# Patient Record
Sex: Female | Born: 1945
Health system: Southern US, Community
[De-identification: ages and names within clinical notes are randomized; demographics above are authoritative.]

## PROBLEM LIST (undated history)

## (undated) DIAGNOSIS — R7989 Other specified abnormal findings of blood chemistry: Secondary | ICD-10-CM

## (undated) DIAGNOSIS — E039 Hypothyroidism, unspecified: Secondary | ICD-10-CM

## (undated) DIAGNOSIS — B3731 Acute candidiasis of vulva and vagina: Secondary | ICD-10-CM

## (undated) DIAGNOSIS — Q2111 Secundum atrial septal defect: Secondary | ICD-10-CM

## (undated) DIAGNOSIS — Z8709 Personal history of other diseases of the respiratory system: Secondary | ICD-10-CM

## (undated) DIAGNOSIS — G4730 Sleep apnea, unspecified: Secondary | ICD-10-CM

## (undated) DIAGNOSIS — R350 Frequency of micturition: Secondary | ICD-10-CM

## (undated) DIAGNOSIS — I10 Essential (primary) hypertension: Secondary | ICD-10-CM

## (undated) DIAGNOSIS — J189 Pneumonia, unspecified organism: Secondary | ICD-10-CM

## (undated) DIAGNOSIS — F32A Depression, unspecified: Secondary | ICD-10-CM

## (undated) DIAGNOSIS — F329 Major depressive disorder, single episode, unspecified: Secondary | ICD-10-CM

## (undated) DIAGNOSIS — G629 Polyneuropathy, unspecified: Secondary | ICD-10-CM

## (undated) DIAGNOSIS — I872 Venous insufficiency (chronic) (peripheral): Secondary | ICD-10-CM

## (undated) DIAGNOSIS — K529 Noninfective gastroenteritis and colitis, unspecified: Secondary | ICD-10-CM

## (undated) DIAGNOSIS — B373 Candidiasis of vulva and vagina: Secondary | ICD-10-CM

## (undated) DIAGNOSIS — M1990 Unspecified osteoarthritis, unspecified site: Secondary | ICD-10-CM

## (undated) DIAGNOSIS — E785 Hyperlipidemia, unspecified: Secondary | ICD-10-CM

## (undated) DIAGNOSIS — I50812 Chronic right heart failure: Secondary | ICD-10-CM

## (undated) DIAGNOSIS — K648 Other hemorrhoids: Secondary | ICD-10-CM

## (undated) DIAGNOSIS — K219 Gastro-esophageal reflux disease without esophagitis: Secondary | ICD-10-CM

## (undated) DIAGNOSIS — E119 Type 2 diabetes mellitus without complications: Secondary | ICD-10-CM

## (undated) DIAGNOSIS — Q211 Atrial septal defect: Secondary | ICD-10-CM

## (undated) DIAGNOSIS — E349 Endocrine disorder, unspecified: Secondary | ICD-10-CM

## (undated) DIAGNOSIS — I5189 Other ill-defined heart diseases: Secondary | ICD-10-CM

## (undated) HISTORY — PX: COLONOSCOPY: SHX174

## (undated) HISTORY — DX: Secundum atrial septal defect: Q21.11

## (undated) HISTORY — DX: Venous insufficiency (chronic) (peripheral): I87.2

## (undated) HISTORY — DX: Gastro-esophageal reflux disease without esophagitis: K21.9

## (undated) HISTORY — PX: PARATHYROIDECTOMY: SHX19

## (undated) HISTORY — PX: OTHER SURGICAL HISTORY: SHX169

## (undated) HISTORY — DX: Essential (primary) hypertension: I10

## (undated) HISTORY — PX: CERVICAL BIOPSY: SHX590

## (undated) HISTORY — DX: Chronic right heart failure: I50.812

## (undated) HISTORY — DX: Other ill-defined heart diseases: I51.89

## (undated) HISTORY — PX: TONSILLECTOMY AND ADENOIDECTOMY: SUR1326

## (undated) HISTORY — DX: Atrial septal defect: Q21.1

## (undated) HISTORY — DX: Hyperlipidemia, unspecified: E78.5

---

## 1998-07-26 ENCOUNTER — Encounter: Payer: Self-pay | Admitting: *Deleted

## 1998-07-26 ENCOUNTER — Ambulatory Visit (HOSPITAL_COMMUNITY): Admission: RE | Admit: 1998-07-26 | Discharge: 1998-07-26 | Payer: Self-pay | Admitting: *Deleted

## 1998-08-20 ENCOUNTER — Ambulatory Visit (HOSPITAL_COMMUNITY): Admission: RE | Admit: 1998-08-20 | Discharge: 1998-08-20 | Payer: Self-pay | Admitting: *Deleted

## 1999-05-02 ENCOUNTER — Ambulatory Visit (HOSPITAL_COMMUNITY): Admission: RE | Admit: 1999-05-02 | Discharge: 1999-05-02 | Payer: Self-pay | Admitting: *Deleted

## 1999-05-02 ENCOUNTER — Encounter: Payer: Self-pay | Admitting: *Deleted

## 1999-07-29 ENCOUNTER — Other Ambulatory Visit: Admission: RE | Admit: 1999-07-29 | Discharge: 1999-07-29 | Payer: Self-pay | Admitting: *Deleted

## 2000-10-05 ENCOUNTER — Other Ambulatory Visit: Admission: RE | Admit: 2000-10-05 | Discharge: 2000-10-05 | Payer: Self-pay | Admitting: *Deleted

## 2000-11-12 ENCOUNTER — Emergency Department (HOSPITAL_COMMUNITY): Admission: EM | Admit: 2000-11-12 | Discharge: 2000-11-12 | Payer: Self-pay | Admitting: Emergency Medicine

## 2001-10-03 ENCOUNTER — Other Ambulatory Visit: Admission: RE | Admit: 2001-10-03 | Discharge: 2001-10-03 | Payer: Self-pay | Admitting: *Deleted

## 2002-11-10 ENCOUNTER — Other Ambulatory Visit: Admission: RE | Admit: 2002-11-10 | Discharge: 2002-11-10 | Payer: Self-pay | Admitting: *Deleted

## 2004-01-07 ENCOUNTER — Ambulatory Visit (HOSPITAL_COMMUNITY): Admission: RE | Admit: 2004-01-07 | Discharge: 2004-01-07 | Payer: Self-pay | Admitting: *Deleted

## 2006-04-06 ENCOUNTER — Ambulatory Visit: Payer: Self-pay | Admitting: Family Medicine

## 2006-05-31 ENCOUNTER — Ambulatory Visit (HOSPITAL_COMMUNITY): Admission: RE | Admit: 2006-05-31 | Discharge: 2006-05-31 | Payer: Self-pay | Admitting: *Deleted

## 2006-07-15 ENCOUNTER — Ambulatory Visit (HOSPITAL_COMMUNITY): Admission: RE | Admit: 2006-07-15 | Discharge: 2006-07-15 | Payer: Self-pay | Admitting: Cardiology

## 2006-07-15 HISTORY — PX: NM MYOCAR PERF WALL MOTION: HXRAD629

## 2006-07-21 ENCOUNTER — Encounter (INDEPENDENT_AMBULATORY_CARE_PROVIDER_SITE_OTHER): Payer: Self-pay | Admitting: Cardiology

## 2006-07-21 ENCOUNTER — Ambulatory Visit (HOSPITAL_COMMUNITY): Admission: RE | Admit: 2006-07-21 | Discharge: 2006-07-21 | Payer: Self-pay | Admitting: Cardiology

## 2006-07-21 HISTORY — PX: CARDIAC CATHETERIZATION: SHX172

## 2006-08-09 ENCOUNTER — Ambulatory Visit (HOSPITAL_COMMUNITY): Admission: RE | Admit: 2006-08-09 | Discharge: 2006-08-09 | Payer: Self-pay | Admitting: Cardiology

## 2006-08-13 ENCOUNTER — Ambulatory Visit (HOSPITAL_COMMUNITY): Admission: RE | Admit: 2006-08-13 | Discharge: 2006-08-14 | Payer: Self-pay | Admitting: Cardiology

## 2006-08-13 HISTORY — PX: TRANSESOPHAGEAL ECHOCARDIOGRAM: SHX273

## 2006-09-10 ENCOUNTER — Ambulatory Visit: Payer: Self-pay | Admitting: Cardiothoracic Surgery

## 2006-10-18 ENCOUNTER — Ambulatory Visit: Payer: Self-pay | Admitting: Cardiothoracic Surgery

## 2006-10-18 ENCOUNTER — Inpatient Hospital Stay (HOSPITAL_COMMUNITY): Admission: RE | Admit: 2006-10-18 | Discharge: 2006-10-23 | Payer: Self-pay | Admitting: Cardiothoracic Surgery

## 2006-11-12 ENCOUNTER — Encounter: Admission: RE | Admit: 2006-11-12 | Discharge: 2006-11-12 | Payer: Self-pay | Admitting: Cardiothoracic Surgery

## 2006-11-12 ENCOUNTER — Ambulatory Visit: Payer: Self-pay | Admitting: Cardiothoracic Surgery

## 2006-12-23 ENCOUNTER — Encounter (HOSPITAL_COMMUNITY): Admission: RE | Admit: 2006-12-23 | Discharge: 2007-03-22 | Payer: Self-pay | Admitting: Cardiology

## 2006-12-23 ENCOUNTER — Ambulatory Visit: Payer: Self-pay | Admitting: Cardiothoracic Surgery

## 2007-03-24 ENCOUNTER — Encounter (HOSPITAL_COMMUNITY): Admission: RE | Admit: 2007-03-24 | Discharge: 2007-04-22 | Payer: Self-pay | Admitting: Cardiology

## 2007-03-24 HISTORY — PX: OTHER SURGICAL HISTORY: SHX169

## 2007-04-24 ENCOUNTER — Encounter (HOSPITAL_COMMUNITY): Admission: RE | Admit: 2007-04-24 | Discharge: 2007-07-23 | Payer: Self-pay | Admitting: Cardiology

## 2007-09-21 HISTORY — PX: OTHER SURGICAL HISTORY: SHX169

## 2008-05-07 IMAGING — CR DG CHEST 1V PORT
1 series · 1 of 1 positions shown · non-contrast
Comparison: 10/11/06

Exam: Chest, one view.

HISTORY: Status post CABG.

[view not recorded]
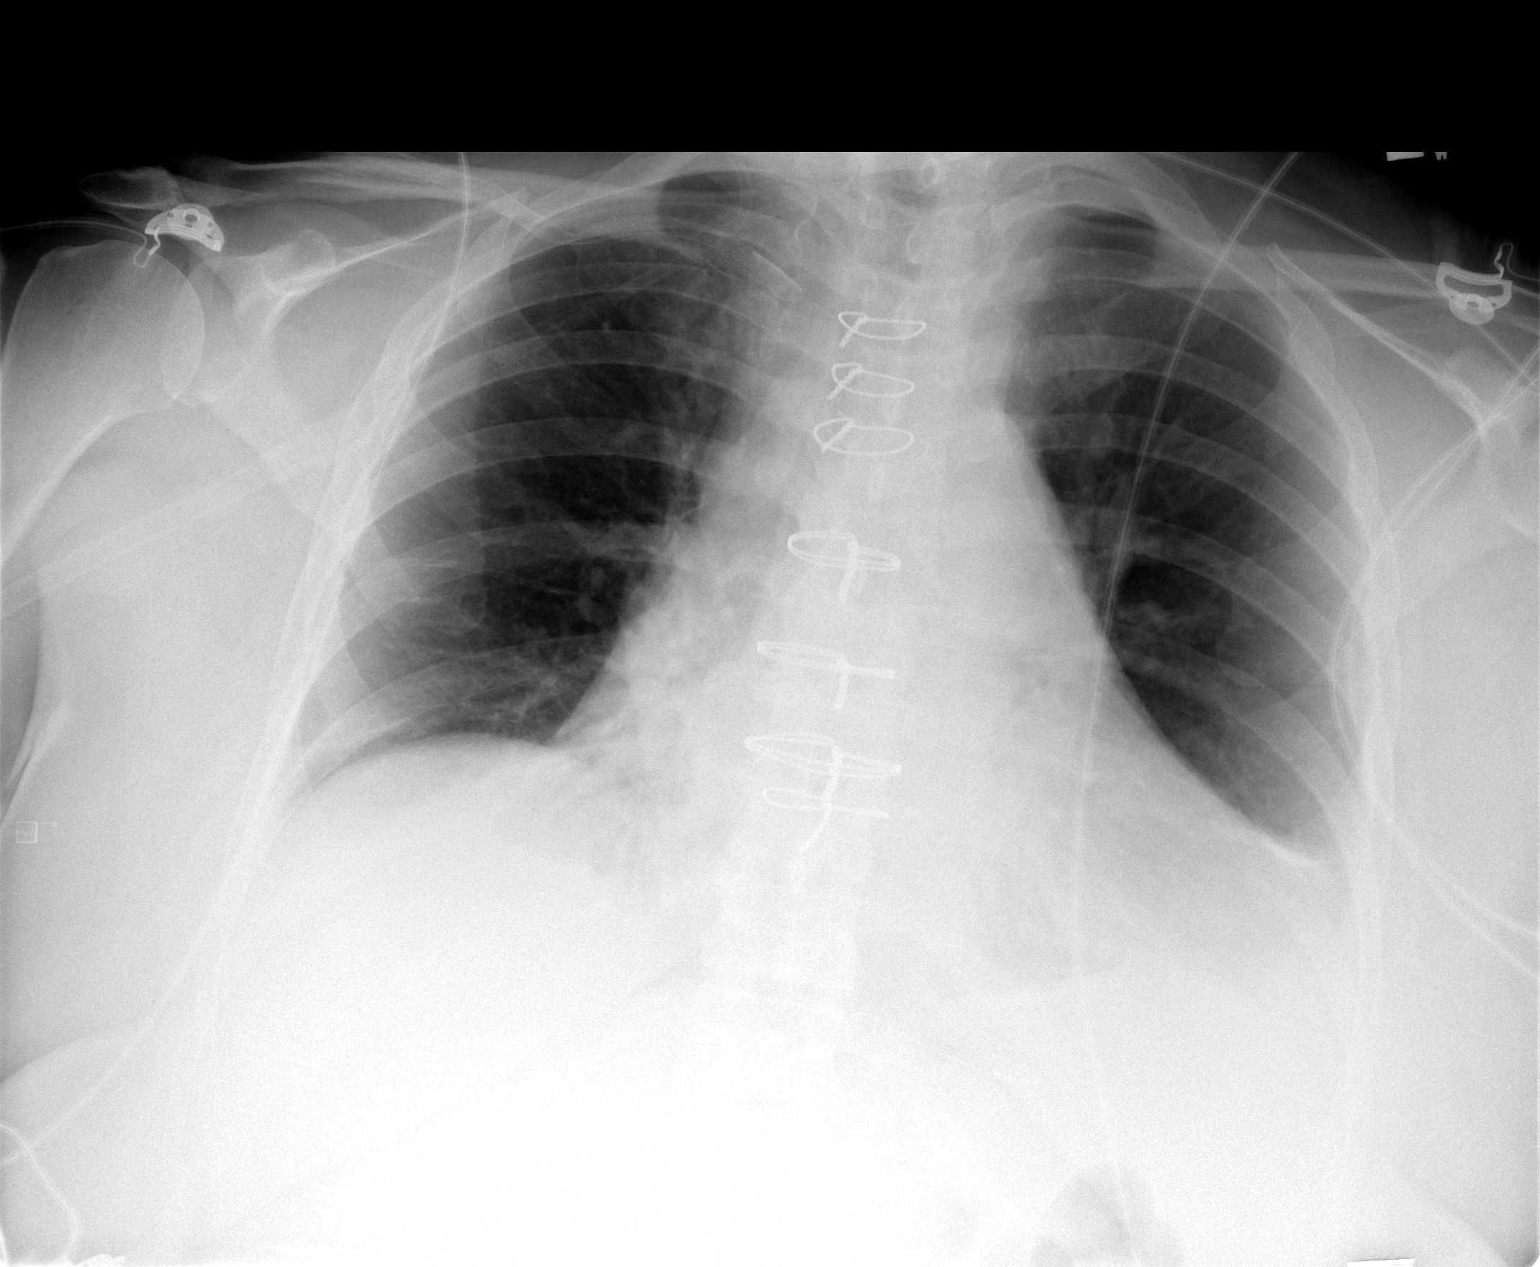

[1 of 1 positions shown; findings below may reference images not displayed]

FINDINGS: Right subclavian catheter sheath remains in place after Swan-Ganz
catheter removal.

The mediastinal drain has been removed.

Cardiac enlargement, stable. 

There is a  small left pleural effusion which is unchanged.
IMPRESSION: 1. Stable small pleural effusion

## 2008-05-08 IMAGING — CR DG CHEST 2V
2 series · 2 of 2 positions shown · non-contrast
Comparison: 10/20/06.

CLINICAL DATA: Atrial septal defect repair.  Post open heart surgery.
 CHEST ? 2 VIEW:

[w chest pa]
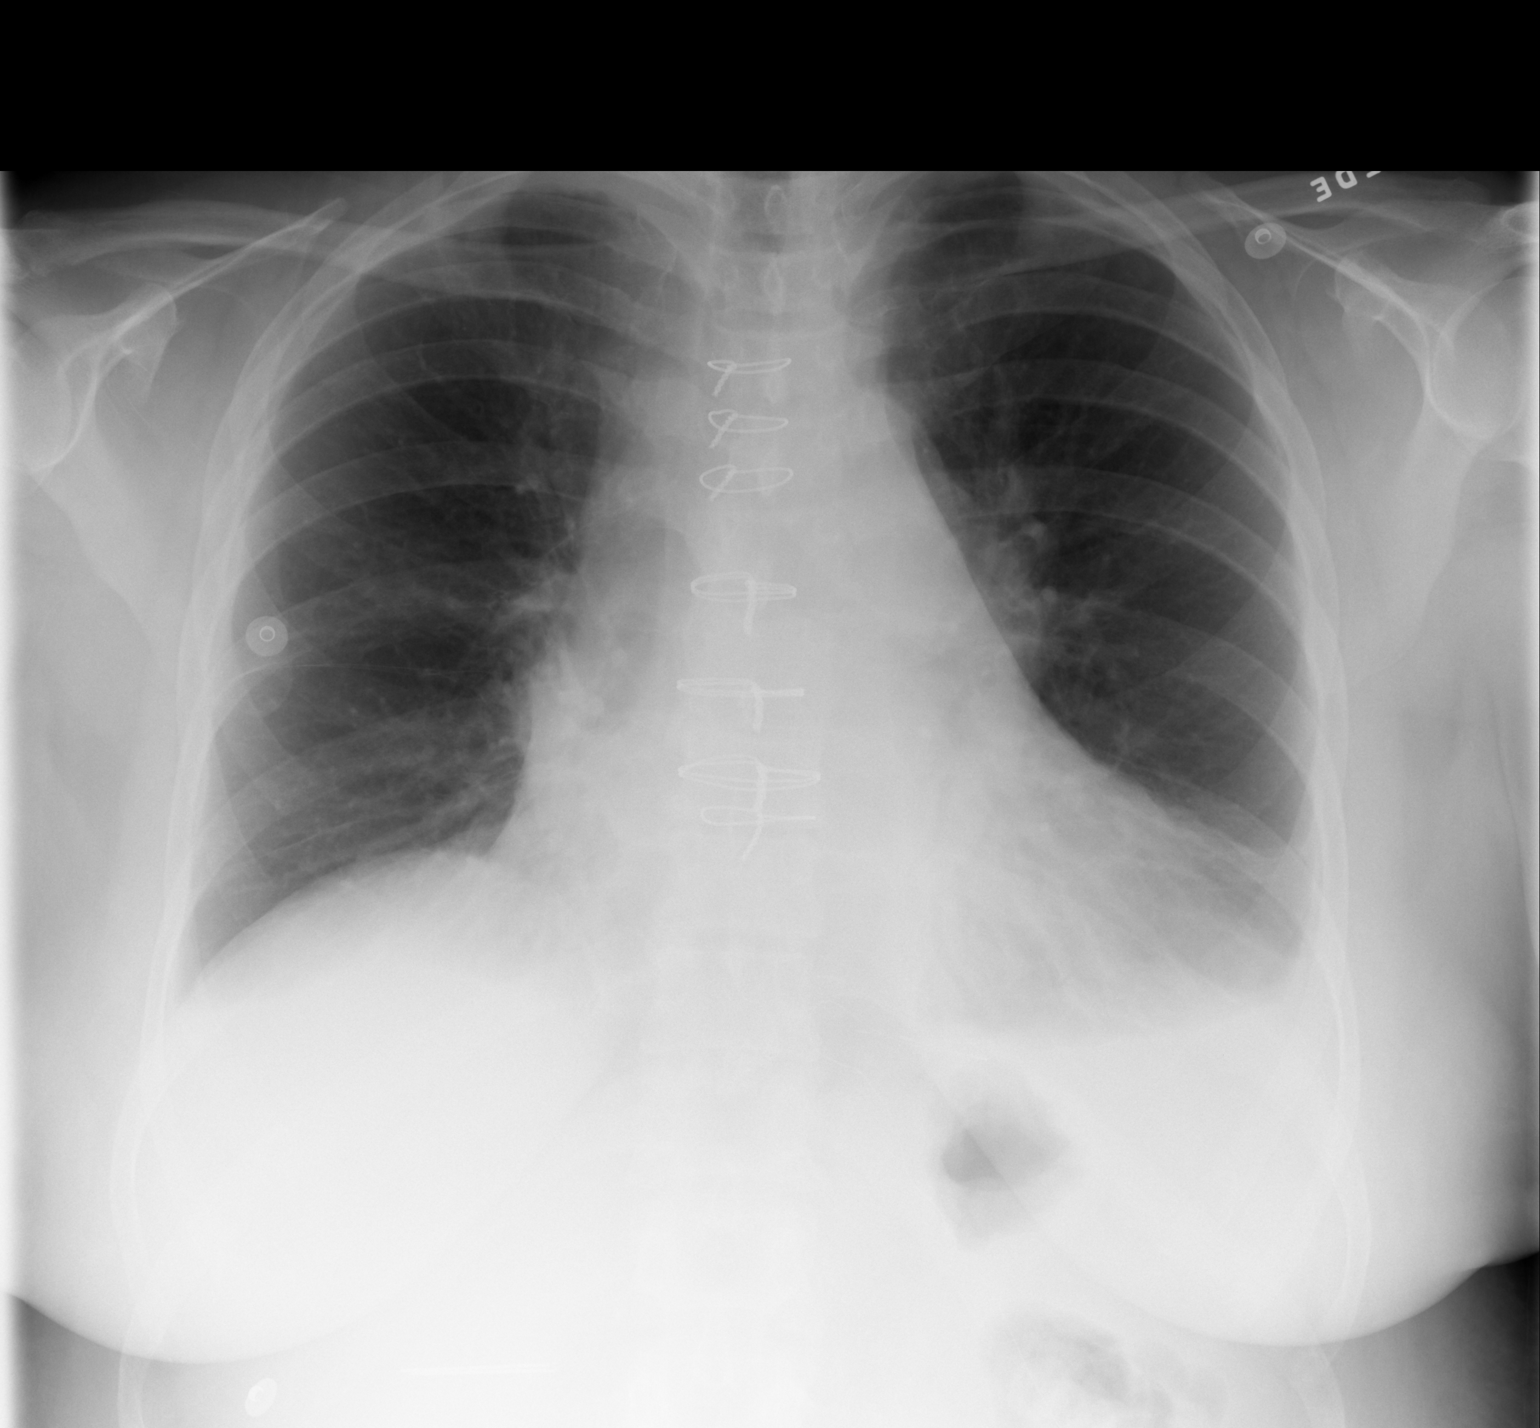

[w chest lat]
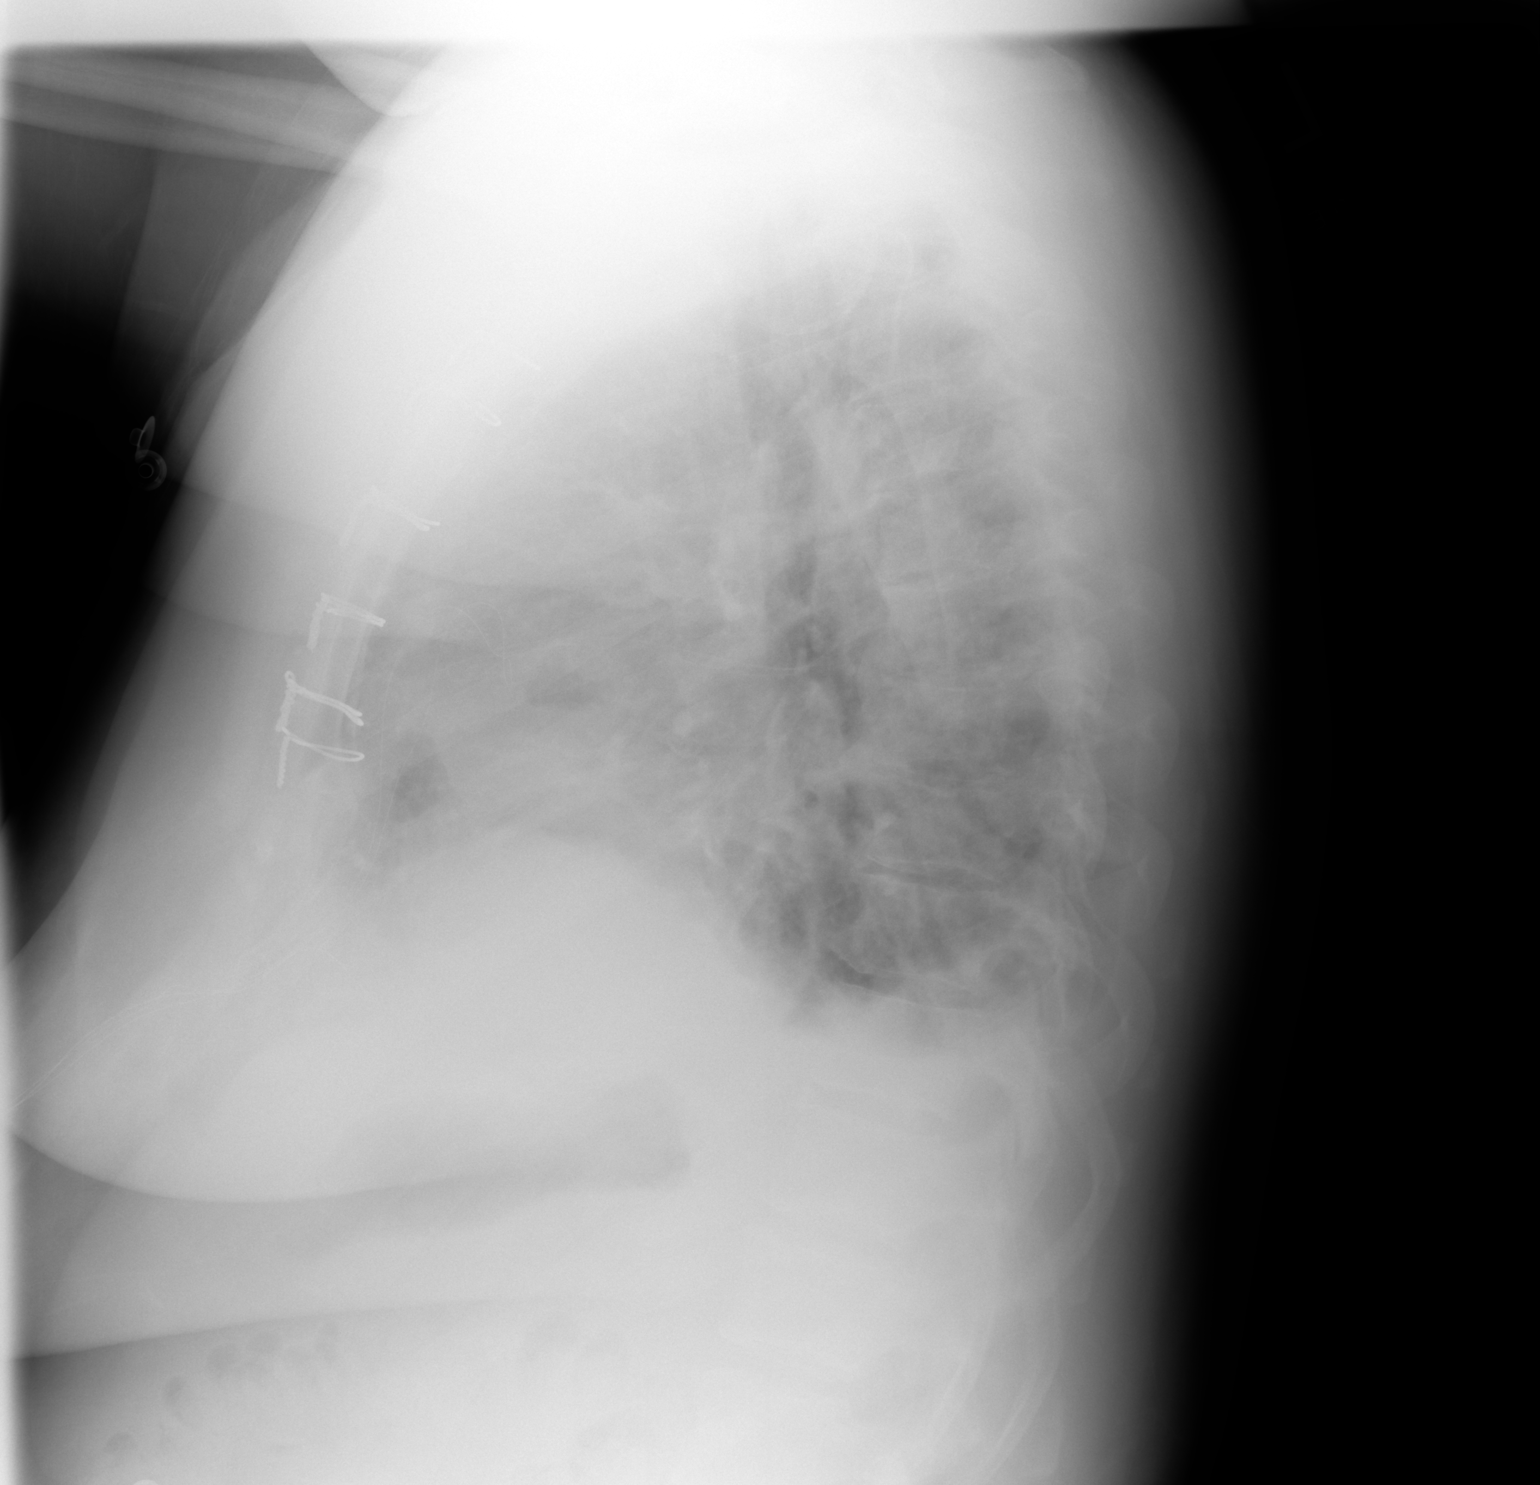

[2 of 2 positions shown; findings below may reference images not displayed]

FINDINGS: Two views of the chest show only mild basilar atelectasis with small effusions.  Cardiomegaly remains.  No pneumothorax is seen.  Median sternotomy sutures are noted.
IMPRESSION: Mild basilar atelectasis with small effusions.  Stable cardiomegaly.

## 2008-08-08 ENCOUNTER — Encounter: Admission: RE | Admit: 2008-08-08 | Discharge: 2008-08-08 | Payer: Self-pay | Admitting: Cardiology

## 2008-09-19 ENCOUNTER — Encounter: Admission: RE | Admit: 2008-09-19 | Discharge: 2008-09-19 | Payer: Self-pay | Admitting: Diagnostic Radiology

## 2008-09-25 ENCOUNTER — Encounter: Admission: RE | Admit: 2008-09-25 | Discharge: 2008-09-25 | Payer: Self-pay | Admitting: Diagnostic Radiology

## 2008-11-21 ENCOUNTER — Encounter: Admission: RE | Admit: 2008-11-21 | Discharge: 2008-11-21 | Payer: Self-pay | Admitting: Diagnostic Radiology

## 2008-12-25 ENCOUNTER — Encounter: Admission: RE | Admit: 2008-12-25 | Discharge: 2008-12-25 | Payer: Self-pay | Admitting: Diagnostic Radiology

## 2010-01-17 ENCOUNTER — Emergency Department (HOSPITAL_COMMUNITY): Admission: EM | Admit: 2010-01-17 | Discharge: 2010-01-17 | Payer: Self-pay | Admitting: Emergency Medicine

## 2010-06-04 LAB — POCT I-STAT, CHEM 8
BUN: 19 mg/dL (ref 6–23)
Calcium, Ion: 1.44 mmol/L — ABNORMAL HIGH (ref 1.12–1.32)
Creatinine, Ser: 1 mg/dL (ref 0.4–1.2)
Glucose, Bld: 100 mg/dL — ABNORMAL HIGH (ref 70–99)
Hemoglobin: 15.3 g/dL — ABNORMAL HIGH (ref 12.0–15.0)
Sodium: 142 mEq/L (ref 135–145)
TCO2: 30 mmol/L (ref 0–100)

## 2010-08-05 NOTE — Assessment & Plan Note (Signed)
OFFICE VISIT   DAY, GREB  DOB:  Apr 16, 1945                                        September 10, 2006  CHART #:  [id number]   PRIMARY CARE PHYSICIAN:  Gildardo Griffes, M.D., [facility],  [facility].   REASON FOR CONSULTATION:  ASD.   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 She started out  with flu-like symptoms but the cough had persisted and she noted, at the  same time, increasing ankle swelling, right leg greater than left.  Because of these symptoms, an echocardiogram was performed, which showed  a secundum type ASD. The patient saw Dr. Vivien Rota in [facility].  Echocardiogram and MRI was performed. The patient was then referred to  Dr. Jacinto Halim for consideration of percutaneous closure. This was not able  to be performed and patient is referred for consideration of surgery.  She has had no previous history of myocardial infarction, angioplasty,  or cardiac surgery. Cardiac risk factors include hypertension,  hyperlipidemia, currently on Zetia. Denies diabetes, denies smoking. The  patient has had no previous stroke, no claudication, no renal  insufficiency. The patient has had no previous stroke, no claudication,  no renal insufficiency. She notes that she has yearly pap smear and  mammogram.   FAMILY HISTORY:  Significant for a father who died in his 84's of a  stroke. Mother died of leukemia. Uncle and grandmother have died of  leukemia.   PAST MEDICAL HISTORY:  Significant for varicose veins, hypothyroidism.   PAST SURGICAL HISTORY:  Tonsillectomy.   SOCIAL HISTORY:  The patient lives alone. 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 Employed by [employer].   CURRENT MEDICATIONS:  Synthroid 100 mcg, Estradiol 0.5 mg, Norethindrone  acetate 5 mg daily, aspirin 81 mg daily, Lasix  20 mg daily, Lisinopril  10 mg daily, and Zetia 10 mg daily.   ALLERGIES:  LIPITOR (muscle pain.) The patient has had no similar  symptoms with Zetia.   REASON FOR ADMISSION:  CARDIOVASCULAR:  Positive for exertional  shortness of breath and especially cough with exertion and lower  extremity edema. Denies chest pain, resting shortness of breath,  palpitations, syncope, pre-syncope, or orthopnea. GENERAL:  The patient  denies any change in weight. HEENT:  Denies blurred vision. CHEST:  Denies chest pressure. Does have shortness of breath with exertion. Does  have cough. Denies hemoptysis. ABDOMEN:  Denies any change in bowel  habits. Denies blood in her stool or history of gallstones. EXTREMITIES:  Does have pedal edema. NEUROLOGIC:  Denies headaches or stroke. Denies  anxiety or depression.   PHYSICAL EXAMINATION:  VITAL SIGNS:  Blood pressure 140/87, pulse 84,  respiratory rate 18. O2 saturation 98%. She is 5 feet 6 inches tall.  Weight 152 pounds.  GENERAL:  Awake, alert, and neurologically intact.  NECK:  No carotid bruits.  LUNGS:  Clear.  HEART:  She does have a second heart sound without specific murmur.  ABDOMEN:  Benign without palpable masses.  EXTREMITIES:  Lower extremities with superficial varicosities, +1 DP and  PT pulses bilaterally. She does have edema, 2+ and slightly greater on  the right than the left.  DIAGNOSTIC STUDIES:  Cardiac catheterization, which shows clear coronary  arteries. She does have evidence of elevated right sided pulmonary  pressures, PA pressure is 45/25, mean of 32. She had a QT/QS ratio of  1.9. A TEE was also performed, showing a large atrial septal defect with  bi-directional shunting. Atrial septum was aneurysmal. There was both  superior and inferior fenestrations noted.   IMPRESSION/RECOMMENDATIONS:  The patient with increasing symptoms of  right sided heart failure, pulmonary hypertension, and a large ASD that  was not  approachable with percutaneous method, with no coronary artery  disease. The patient is referred for surgical closure of ASD. I agree  with this recommendation and have discussed it with the patient. Various  approaches to sternotomy versus right thoracotomy with femoral bypass  have all been discussed with the pateint and she is willing to proceed.  With her significant obesity, a sternotomy approach may actually be  easier. The patient is agreeable with this. She has requested to wait  until late July, when her sister will be available to help care for her  postoperatively. Will tentatively plan for surgery on October 18, 2006.   Sheliah Plane, MD  Electronically Signed   EG/MEDQ  D:  09/10/2006  T:  09/11/2006  Job:  [id number]   cc:   Gildardo Griffes, M.D.  Cristy Hilts. Jacinto Halim, MD

## 2010-08-05 NOTE — H&P (Signed)
Amber Cook, Amber Cook NO.:  [id number]   MEDICAL RECORD NO.:  [id number]          PATIENT TYPE:  INP   LOCATION:  2023                         FACILITY:  [facility]   PHYSICIAN:  Sheliah Plane, MD    DATE OF BIRTH:  [birth year]   DATE OF ADMISSION:  10/18/2006  DATE OF DISCHARGE:  10/23/2006                              HISTORY & PHYSICAL   PRIMARY CARE PHYSICIAN:  Dr. Gildardo Griffes, [facility].   CARDIOLOGIST:  Cristy Hilts. Jacinto Halim, MD.   This is an updated history and physical from September 10, 2006, when the  patient was seen in the office.  There have been no changes, since she  was seen in the office in consultation for ASD repair.   REASON FOR CONSULTATION:  ASD.   HISTORY OF PRESENT ILLNESS:  This patient is a 65 year old female, works  as a Armed forces technical officer, who had increasing shortness of breath and  cough in February of 2008.  Evaluation including echocardiogram, MRI,  demonstrate a secundum-type ASD.  Attempts at closure by Dr. Jacinto Halim were  unsuccessful, and patient was referred for a surgical closure.  She  denies diabetes, smoking.  No previous stroke, no claudication, no renal  insufficiency.   FAMILY HISTORY:  Significant for a father who died in his 31s of a  stroke.  Mother died of leukemia.  Uncle and a grandmother have had  leukemia.   PAST MEDICAL HISTORY:  In addition to the above, significant for  varicose veins and hyperthyroidism.   PAST SURGICAL HISTORY:  __________ tonsillectomy.   SOCIAL HISTORY:  The patient lives alone, works as a Armed forces technical officer,  [employer].   CURRENT MEDICATIONS:  1. Synthroid 100 mcg a day.  2. Estradiol 0.5 mg.  3. Norethindrone acetate 5 mg a day.  4. Aspirin 81 mg a day.  5. Lasix 20 mg a day.  6. Lisinopril 10 mg a day.  7. Zetia 10 mg a day.   ALLERGIES:  LIPITOR CAUSES MUSCLE PAIN.   REVIEW OF SYSTEMS:  Please see previous review of systems from June 20.  There have been no changes.   PHYSICAL  EXAMINATION:  VITAL SIGNS:  Blood pressure is 140/80, pulse 70,  respiratory rate is 18.  O2 sat is 98%, 5 feet, 6 inches tall, 152  pounds.  Weight is 114 kg.  NEUROLOGIC:  Patient is neurologically intact, no carotid bruits.  LUNGS:  Clear.  CARDIOVASCULAR:  She has a second heart sound, otherwise no appreciable  murmur.  ABDOMEN:  Without masses.  EXTREMITIES:  She has 1+ DP and PT and DP pulses, slight edema, left  greater than right.   DIAGNOSTIC STUDIES:  Again reviewed and unchanged from September 10, 2006,  when the patient was confirmed to have no significant coronary disease  with ASD.  The patient is admitted for closure of ASD.      Sheliah Plane, MD  Electronically Signed     EG/MEDQ  D:  01/05/2007  T:  01/05/2007  Job:  [id number]

## 2010-08-05 NOTE — Discharge Summary (Signed)
NAMELARESHA, BACORN NO.:  [id number]   MEDICAL RECORD NO.:  [id number]          PATIENT TYPE:  INP   LOCATION:  2023                         FACILITY:  [facility]   PHYSICIAN:  Sheliah Plane, MD    DATE OF BIRTH:  1945/08/15   DATE OF ADMISSION:  10/18/2006  DATE OF DISCHARGE:  10/23/2006                               DISCHARGE SUMMARY   HISTORY OF PRESENT ILLNESS:  The patient is a 64 year old female through  noted increasing cough and exertional shortness of breath in February of  this year.  She started out with flu-like symptoms, but the cough  persisted and she noted ankle swelling, right greater than left.  Because of these symptoms an echocardiogram was obtained which showed a  secundum type  ASD.  She saw Dr. Deno Etienne in [facility]  and an  echocardiogram and MRI were performed and she was referred to Dr. Jacinto Halim  for consideration of percutaneous closure.  This was not able to be  performed and the patient was subsequently referred to Dr. Tyrone Sage for  consideration of surgery.  The patient was admitted this hospitalization  for the procedure.   PAST MEDICAL HISTORY:  Is significant for varicose veins and  hypothyroidism.   PAST SURGICAL HISTORY:  Includes tonsillectomy.   MEDICATIONS PRIOR TO ADMISSION:  1. Synthroid 100 mcg daily  2. Estradiol 0.5 mg daily  3. Norethindrone acetate 5 mg daily  4. Aspirin 81 mg daily  5. Lasix 20 mg daily  6. Lisinopril 10 mg daily  7. Zetia 10 mg daily.   ALLERGIES:  LIPITOR for which causes muscle pain.  She has not had  similar symptoms of this with Zetia.   Family history, social history, review of systems and physical exam  please see history and physical done at time admission.   HOSPITAL COURSE:  The patient was admitted electively and on 10/18/2006  taken to the operating room where she underwent repair of an ASD with  pericardial patch.  This procedure was performed by Dr. Tyrone Sage,  tolerated well and she  was taken to the surgical intensive care unit in  stable condition.   POSTOPERATIVE HOSPITAL COURSE:  The patient has done well.  She has  maintained stable hemodynamics.  She is neurologically intact.  She was  weaned from the ventilator without difficulty.  All routine lines,  monitors and drainage devices were discontinued in the standard fashion.  She has had no significant cardiac dysrhythmias.  Her laboratory values  are stable.  She does have a mild anemia.  Most recent  hemoglobin/hematocrit dated 10/21/2006 are 11 and 33 respectively.  Electrolytes, BUN and creatinine are within normal limits.  Oxygen has  been weaned and she maintained good saturations on room air.  Incisions  are healing without evidence of infection.  She is tolerating routine  advancement in activity commensurate to level of postoperative  convalescence using standard cardiac rehabilitation phase one  modalities.  Overall her status is felt to currently stable for  discharge at the morning of October 23, 2006 pending morning round  reevaluation.  MEDICATIONS ON DISCHARGE:  1. Aspirin 325 mg daily.  2. Toprol XL 25 mg daily  3. Lisinopril 5 mg daily.  4. Synthroid 100 mcg daily.  5. Zetia 10 mg daily  6. Lasix 40 mg daily for 5 days.  7. K-Dur 20 mEq daily for 5 days.  8. For pain, Ultram 50 mg one or two every 6 hours as needed.   INSTRUCTIONS:  The patient received written instructions regarding  medications, activity, diet, wound care and follow-up.   FOLLOW UP:  With Dr. Donata Clay for  Dr. Tyrone Sage on  11/12/2006 as Dr.  Tyrone Sage will be at the [facility] office.  Additionally she is  instructed to follow up with Dr. Jacinto Halim in 2 weeks   CONDITION ON DISCHARGE:  Stable, improved.   FINAL DIAGNOSIS:  1. Secundum type atrial septal defect now status post pericardial      patch repair.  2. Other diagnoses as previously listed per the history  3. Mild postoperative blood loss anemia.       Rowe Clack, P.A.-C.      Sheliah Plane, MD  Electronically Signed    WEG/MEDQ  D:  10/22/2006  T:  10/22/2006  Job:  [id number]   cc:   Sheliah Plane, MD  Care in [facility] Gildardo Griffes, M.D., [facility] R. Jacinto Halim, MD

## 2010-08-05 NOTE — Cardiovascular Report (Signed)
NAME:  Amber Cook, HOHMAN NO.:  [id number]   MEDICAL RECORD NO.:  [id number]          PATIENT TYPE:  OIB   LOCATION:  2807                         FACILITY:  [facility]   PHYSICIAN:  Cristy Hilts. Jacinto Halim, MD       DATE OF BIRTH:  1945-08-09   DATE OF PROCEDURE:  08/13/2006  DATE OF DISCHARGE:                            CARDIAC CATHETERIZATION   PROCEDURE PERFORMED:  1. Intracardiac echocardiogram.  2. Attempted ASD closure with Amplatzer ASD closure device.   INDICATIONS:  Ms. Quinci Gavidia is a 65 year old female with history of  hypertension, hyperlipidemia, and diabetes who was has been complaining  of increasing shortness of breath.  She was found to have a moderate  sized atrial septal defect which measured to be 1.1-cm.  There was also  fenestrated atrial septum.  Given right ventricular strain, and the  shunt ratio of greater than 1.8, and a shortness of breath, we felt that  she should proceed with ASD closure.   INTRACARDIAC ECHOCARDIOGRAM:  Intracardiac echocardiogram confirmed a  large atrial septal defect with bidirectional shunting.  The intra-  atrial septum was aneurysmal.  It was also fenestrated.  There was both  a superior and an inferior fenestrating noted just above the inferior  large atrial septal defect.   INTERVENTION DATA:  Unsuccessful attempt at ASD closure.  In spite of  exchanging the device from 12-mm to 14-mm device, I had excellent  capture of the atrial septal defect itself, but because of the absence  of inferior lip for support, the device kept prolapsing either into the  right atrium or mostly into the left atrium and unable to position the  device in spite of multiple manipulations.  Having attempted multiple  times even with a little larger device, I felt it was prudent to take  her off the table and review the images again, but I suspect she  probably will need surgical closure of the atrial septal defect.   COMPLICATIONS:  No  immediate complications were noted.  The patient  tolerated the procedure well.   TECHNIQUE OF THE PROCEDURE:  Under usual sterile precautions, using an 8-  [place] right femoral venous and a 9-French right femoral venous access,  the intracardiac echo probe was advanced into the right atrium and the  cardiac structures were analyzed.  Then a 6-French multipurpose A2  catheter was advanced into the right atrium and it was advanced through  the major defect into the left atrium and was positioned in the left  upper pulmonary vein.  Then a 290-cm Rosen wire was utilized to position  into the left upper femoral vein and a 9-French pinnacle sheath was  advanced into left atrium.  Then initially a 12-mm and then a 14-mm ASD  septal occluder was utilized to deploy the device cover the atrial  septal defect.  With a 14-mm device, I was able to obtain most of the  defect closure with very minimal residual.  That was through the  fenestrated atrial septal defect.  However, the device itself was very  close to the fenestrated atrial septal defect  and hence, I felt that  this could potentially close by itself.  However, there was a superior  defect, but we should be felt that we could potentially leave it alone.  However, once  we manipulated the device to see the stability of the device, the device  easily prolapsed back again into the left atrium and then into the right  atrium.  Given this, the procedure was abandoned.   There was no immediate complication noted.      Cristy Hilts. Jacinto Halim, MD  Electronically Signed     JRG/MEDQ  D:  08/13/2006  T:  08/13/2006  Job:  [id number]   cc:   Lawerance Bach, Dr.

## 2010-08-05 NOTE — Op Note (Signed)
Amber Cook, Amber Cook NO.:  [id number]   MEDICAL RECORD NO.:  [id number]          PATIENT TYPE:  INP   LOCATION:  2023                         FACILITY:  [facility]   PHYSICIAN:  Sheliah Plane, MD    DATE OF BIRTH:  [birth year]   DATE OF PROCEDURE:  10/21/2006  DATE OF DISCHARGE:                               OPERATIVE REPORT   PREOPERATIVE DIAGNOSIS:  Large secundum atrial septal defect.   POSTOPERATIVE DIAGNOSIS:  Large secundum atrial septal defect.   PATCH PROCEDURE:  Patch closure of atrial septal defect with autologous  pericardium.   SURGEON:  Sheliah Plane, MD   FIRST ASSISTANT:  Theda Belfast, [place]   BRIEF HISTORY:  The patient is a 65 year old female who had begun having  symptoms of congestive heart failure with pedal edema and increasing  shortness of breath and was evaluated by Dr. Cristy Hilts. Gan[place].  In the  cardiac workup echocardiogram was done which showed evidence of large  secundum type ASD with fenestrations.  Attempts at closure with  clamshell device was not technically possible and the patient was  referred for surgical closure.  The risks and options of surgery were  discussed with the patient who is agreeable and willing to proceed.   DESCRIPTION OF PROCEDURE:  The patient underwent general endotracheal  anesthesia with central line and arterial lines in place.  Skin of chest  and legs were prepped with Betadine and draped in the usual standard  manner.  Median sternotomy was performed.  Pericardium was opened.  The  patient had events of right ventricular and right atrial enlargement.  Concomitantly a TE probe was placed and confirmed the diagnosis of  enlarged atrial septal defect with multiple separate orifices.  The  mitral valve was and aortic valve were normal.  Tricuspid valve was  normal.  The patient was systemically heparinized. Ascending aorta was  cannulated.  Superior and inferior venal caval tapes were placed and  right  angle cannula was placed into the superior and inferior vena cava.  The patient was placed on cardiopulmonary bypass 2.4 L/min./m2 and  aortic root cardioplegic needle was introduced into the ascending aorta.  The patient was placed on cardiopulmonary bypass at 2.4 L/min./m2.  Body  temperature gradually dropped to 32 degree.  Aortic cross clamp was  applied and 500 mL of cold blood test cardioplegia was administered with  diastolic arrest of the heart.  Table tapes were secured and right  atrium was opened and this gave good exposure of large secundum ASD.  The superior rim was free.  There was fenestrated area of tissue  involving the lower rim which was very redundant with several holes.  It  was felt most appropriate to perform a patch closure as a portion of the  anterior pericardium was excised with the serosal surface placed towards  the left atrium.  The redundant thinned out area of septum with multiple  fenestrations was excised to make 1 single opening using 4-0 Prolene  sutures.  The patch was then used to close the ASD.  With prior to  complete closure,  the left heart was allowed to passive fill and de-air.  Body temperature is rewarmed to 37 degrees.  The patch closure was  completed.  The right atrium was then closed with horizontal mattress  Prolene suture.  The right heart was also found to fill and de-air.  Aortic cross clamp was removed.  Total cross clamp time was 53 minutes.  The patient spontaneously converted to sinus rhythm with the body  temperature rewarmed to 37 degrees, she was ventilated and weaned from  cardiopulmonary bypass.  Post procedure the TE showed complete closure  of the ASD without any events of patency of flow across the repaired  area.  The patient remained hemodynamically stable was decannulated in  the usual fashion.  Protamine sulfate was administered with the  operative field hemostatic.  Two ventricular placement wires applied.  A  single  Blake drain was left in the mediastinum.  Sternum was closed with  #6 stainless steel wire. Fascia closed with interrupted 0 Vicryl,  running 3-0 Vicryl in the subcutaneous tissue and 4-0 in the  subcuticular stitch in the skin edges.  Dry dressing was applied.  Sponge and needle count was reported as correct at the conclusion of the  procedure.  The patient tolerated the procedure without obvious  complication and was transferred to the surgical intensive care unit for  further postoperative care.      Sheliah Plane, MD  Electronically Signed     EG/MEDQ  D:  10/21/2006  T:  10/22/2006  Job:  [id number]   cc:   Donnie Coffin, MD  Cristy Hilts. Jacinto Halim, MD

## 2010-08-05 NOTE — Assessment & Plan Note (Signed)
OFFICE VISIT   MCKINSEY, KEAGLE  DOB:  25-May-1945                                        December 23, 2006  CHART #:  [id number]   The patient returns to the office today for a second followup visit  after repair of a large secundum atrial septal defect on October 21, 2006.  She is doing reasonably well at home with the exception of increasing  pedal edema after she stopped her Lasix that she was discharged home on.  She also notes that her blood pressure had been low on the lisinopril  and Toprol she was discharged home on. She now has seen Dr.  Lawerance Bach who  had decreased her dose of lisinopril and finally stopped it because of  low blood pressures.   PHYSICAL EXAMINATION:  Blood pressure 137/79, pulse 88 and regular.  Respiratory rate is 18. O2 sats are 98%.  Her sternum is stable and healing well. She has a very small amount of  keloid formation at the lower end of the incision. Breath sounds are  clear bilaterally. I do not appreciate any murmur. She does have +1 to  +2 pedal edema bilaterally.   She notes that Dr.  Lawerance Bach had recently started her back on Lasix 40 mg  once a day. She has seen Dr.  Jacinto Halim in followup and does not have  another appointment for a year. Her Toprol prescription that she was  discharged home on was renewed or Toprol XL 25 mg once a day for three  months. I have told her to continue this and discuss it with Dr.  Jacinto Halim  whether to continue the Toprol indefinitely.   Overall, she seems to be making good progress. I have allowed her to  return to work on October 20th and avoid any lifting over 25 pounds.   Sheliah Plane, MD  Electronically Signed   EG/MEDQ  D:  12/23/2006  T:  12/23/2006  Job:  [id number]   cc:   Susie L. Burns, R.N.  Vonna Kotyk R. Jacinto Halim, MD

## 2011-01-05 LAB — BASIC METABOLIC PANEL
BUN: 12
BUN: 12
BUN: 12
CO2: 23
CO2: 28
CO2: 29
Calcium: 8.6
Calcium: 9.2
Calcium: 9.6
Chloride: 105
Chloride: 105
Chloride: 108
Creatinine, Ser: 0.84
Creatinine, Ser: 0.85
Creatinine, Ser: 0.92
GFR calc Af Amer: >60
GFR calc Af Amer: >60
GFR calc Af Amer: >60
GFR calc non Af Amer: >60
GFR calc non Af Amer: >60
GFR calc non Af Amer: >60
Glucose, Bld: 105 — ABNORMAL HIGH
Glucose, Bld: 115 — ABNORMAL HIGH
Glucose, Bld: 135 — ABNORMAL HIGH
Potassium: 3.7
Potassium: 4
Potassium: 4.2
Sodium: 134 — ABNORMAL LOW
Sodium: 137
Sodium: 138

## 2011-01-05 LAB — CBC
HCT: 31.1 — ABNORMAL LOW
HCT: 33 — ABNORMAL LOW
HCT: 33.3 — ABNORMAL LOW
HCT: 35.6 — ABNORMAL LOW
HCT: 35.8 — ABNORMAL LOW
HCT: 36.2
HCT: 42.1
Hemoglobin: 10.5 — ABNORMAL LOW
Hemoglobin: 11.1 — ABNORMAL LOW
Hemoglobin: 11.4 — ABNORMAL LOW
Hemoglobin: 12
Hemoglobin: 12.2
Hemoglobin: 12.6
Hemoglobin: 14.2
MCHC: 33.6
MCHC: 33.7
MCHC: 33.7
MCHC: 33.7
MCHC: 34.2
MCHC: 34.2
MCHC: 34.7
MCV: 91.4
MCV: 92.2
MCV: 92.4
MCV: 93
MCV: 93.3
MCV: 93.8
MCV: 94
Platelets: 145 — ABNORMAL LOW
Platelets: 154
Platelets: 165
Platelets: 168
Platelets: 171
Platelets: 175
Platelets: 282
RBC: 3.31 — ABNORMAL LOW
RBC: 3.51 — ABNORMAL LOW
RBC: 3.6 — ABNORMAL LOW
RBC: 3.82 — ABNORMAL LOW
RBC: 3.88
RBC: 3.96
RBC: 4.53
RDW: 12.9
RDW: 13
RDW: 13.1
RDW: 13.2
RDW: 13.3
RDW: 13.3
RDW: 13.4
WBC: 11.7 — ABNORMAL HIGH
WBC: 12.1 — ABNORMAL HIGH
WBC: 12.2 — ABNORMAL HIGH
WBC: 14.4 — ABNORMAL HIGH
WBC: 7.1
WBC: 8.3
WBC: 8.9

## 2011-01-05 LAB — POCT I-STAT 4, (NA,K, GLUC, HGB,HCT)
Glucose, Bld: 109 — ABNORMAL HIGH
Glucose, Bld: 117 — ABNORMAL HIGH
Glucose, Bld: 120 — ABNORMAL HIGH
Glucose, Bld: 122 — ABNORMAL HIGH
Glucose, Bld: 122 — ABNORMAL HIGH
HCT: 26 — ABNORMAL LOW
HCT: 28 — ABNORMAL LOW
HCT: 30 — ABNORMAL LOW
HCT: 36
HCT: 36
Hemoglobin: 10.2 — ABNORMAL LOW
Hemoglobin: 12.2
Hemoglobin: 12.2
Hemoglobin: 8.8 — ABNORMAL LOW
Hemoglobin: 9.5 — ABNORMAL LOW
Operator id: 274841
Operator id: 3390
Operator id: 3390
Operator id: 3390
Operator id: 3390
Potassium: 3.8
Potassium: 4
Potassium: 4.4
Potassium: 4.8
Potassium: 5.3 — ABNORMAL HIGH
Sodium: 131 — ABNORMAL LOW
Sodium: 135
Sodium: 135
Sodium: 136
Sodium: 138

## 2011-01-05 LAB — CREATININE, SERUM
Creatinine, Ser: 0.87
Creatinine, Ser: 1.01
GFR calc Af Amer: >60
GFR calc Af Amer: >60
GFR calc non Af Amer: 56 — ABNORMAL LOW
GFR calc non Af Amer: >60

## 2011-01-05 LAB — I-STAT EC8
Acid-base deficit: 4 — ABNORMAL HIGH
BUN: 12
Bicarbonate: 20.5
Chloride: 107
Glucose, Bld: 158 — ABNORMAL HIGH
HCT: 40
Hemoglobin: 13.6
Operator id: 271091
Potassium: 4.3
Sodium: 138
TCO2: 22
pCO2 arterial: 36.7
pH, Arterial: 7.355

## 2011-01-05 LAB — POCT I-STAT 3, ART BLOOD GAS (G3+)
Acid-base deficit: 2
Acid-base deficit: 4 — ABNORMAL HIGH
Acid-base deficit: 5 — ABNORMAL HIGH
Bicarbonate: 20
Bicarbonate: 21.2
Bicarbonate: 22.6
Bicarbonate: 26.4 — ABNORMAL HIGH
O2 Saturation: 100
O2 Saturation: 100
O2 Saturation: 93
O2 Saturation: 97
Operator id: 271091
Operator id: 274841
Operator id: 3390
Operator id: 3390
Patient temperature: 33.2
Patient temperature: 36.1
Patient temperature: 36.4
Patient temperature: 37
TCO2: 21
TCO2: 22
TCO2: 24
TCO2: 28
pCO2 arterial: 35
pCO2 arterial: 35.7
pCO2 arterial: 37.3
pCO2 arterial: 41.3
pH, Arterial: 7.361
pH, Arterial: 7.378
pH, Arterial: 7.391
pH, Arterial: 7.396
pO2, Arterial: 225 — ABNORMAL HIGH
pO2, Arterial: 338 — ABNORMAL HIGH
pO2, Arterial: 66 — ABNORMAL LOW
pO2, Arterial: 92

## 2011-01-05 LAB — I-STAT 8, (EC8 V) (CONVERTED LAB)
BUN: 15
Bicarbonate: 25.1 — ABNORMAL HIGH
Chloride: 102
Glucose, Bld: 146 — ABNORMAL HIGH
HCT: 37
Hemoglobin: 12.6
Operator id: 274841
Potassium: 3.7
Sodium: 137
TCO2: 26
pCO2, Ven: 40.5 — ABNORMAL LOW
pH, Ven: 7.401 — ABNORMAL HIGH

## 2011-01-05 LAB — BLOOD GAS, ARTERIAL
Acid-base deficit: 1.6
Bicarbonate: 22.3
Drawn by: 181601
FIO2: 0.21
O2 Saturation: 96.8
Patient temperature: 98.6
TCO2: 23.4
pCO2 arterial: 35.6
pH, Arterial: 7.413 — ABNORMAL HIGH
pO2, Arterial: 83.7

## 2011-01-05 LAB — PROTIME-INR
INR: 0.9
INR: 1.3
Prothrombin Time: 12.3
Prothrombin Time: 16.8 — ABNORMAL HIGH

## 2011-01-05 LAB — COMPREHENSIVE METABOLIC PANEL
ALT: 29
AST: 21
Albumin: 3.6
Alkaline Phosphatase: 87
BUN: 19
CO2: 22
Calcium: 10.7 — ABNORMAL HIGH
Chloride: 110
Creatinine, Ser: 0.82
GFR calc Af Amer: >60
GFR calc non Af Amer: >60
Glucose, Bld: 100 — ABNORMAL HIGH
Potassium: 4.4
Sodium: 136
Total Bilirubin: 0.6
Total Protein: 6.4

## 2011-01-05 LAB — POCT I-STAT GLUCOSE
Glucose, Bld: 122 — ABNORMAL HIGH
Operator id: 3390

## 2011-01-05 LAB — TYPE AND SCREEN
ABO/RH(D): A POS
Antibody Screen: NEGATIVE

## 2011-01-05 LAB — POCT I-STAT 3, VENOUS BLOOD GAS (G3P V)
Acid-base deficit: 1
Bicarbonate: 25 — ABNORMAL HIGH
O2 Saturation: 84
Operator id: 3390
Patient temperature: 33.2
TCO2: 27
pCO2, Ven: 41.2 — ABNORMAL LOW
pH, Ven: 7.374 — ABNORMAL HIGH
pO2, Ven: 41

## 2011-01-05 LAB — URINALYSIS, ROUTINE W REFLEX MICROSCOPIC
Bilirubin Urine: NEGATIVE
Glucose, UA: NEGATIVE
Hgb urine dipstick: NEGATIVE
Ketones, ur: NEGATIVE
Nitrite: NEGATIVE
Protein, ur: NEGATIVE
Specific Gravity, Urine: 1.028
Urobilinogen, UA: 1
pH: 5.5

## 2011-01-05 LAB — MAGNESIUM
Magnesium: 2.3
Magnesium: 2.4
Magnesium: 2.8 — ABNORMAL HIGH

## 2011-01-05 LAB — HEMOGLOBIN AND HEMATOCRIT, BLOOD
HCT: 25.8 — ABNORMAL LOW
Hemoglobin: 8.8 — ABNORMAL LOW

## 2011-01-05 LAB — APTT
aPTT: 24
aPTT: 34

## 2011-01-05 LAB — PLATELET COUNT: Platelets: 168

## 2011-01-05 LAB — ABO/RH: ABO/RH(D): A POS

## 2011-01-05 LAB — HEMOGLOBIN A1C: Hgb A1c MFr Bld: 5.7

## 2011-05-19 ENCOUNTER — Ambulatory Visit
Admission: RE | Admit: 2011-05-19 | Discharge: 2011-05-19 | Disposition: A | Discharge status: Home / Self Care | Payer: Medicare Other | Source: Ambulatory Visit | Attending: Endocrinology | Admitting: Endocrinology

## 2011-05-19 ENCOUNTER — Other Ambulatory Visit: Payer: Self-pay | Admitting: Endocrinology

## 2011-05-19 DIAGNOSIS — R609 Edema, unspecified: Secondary | ICD-10-CM

## 2011-05-19 DIAGNOSIS — R52 Pain, unspecified: Secondary | ICD-10-CM

## 2011-10-20 ENCOUNTER — Other Ambulatory Visit (HOSPITAL_COMMUNITY): Payer: Self-pay | Admitting: Endocrinology

## 2011-10-20 DIAGNOSIS — R7989 Other specified abnormal findings of blood chemistry: Secondary | ICD-10-CM

## 2011-10-28 ENCOUNTER — Encounter (HOSPITAL_COMMUNITY)
Admission: RE | Admit: 2011-10-28 | Discharge: 2011-10-28 | Disposition: A | Discharge status: Home / Self Care | Payer: Medicare Other | Source: Ambulatory Visit | Attending: Endocrinology | Admitting: Endocrinology

## 2011-10-28 DIAGNOSIS — E215 Disorder of parathyroid gland, unspecified: Secondary | ICD-10-CM | POA: Insufficient documentation

## 2011-10-28 DIAGNOSIS — R7989 Other specified abnormal findings of blood chemistry: Secondary | ICD-10-CM

## 2011-10-28 MED ORDER — TECHNETIUM TC 99M SESTAMIBI - CARDIOLITE
25.0000 | Freq: Once | INTRAVENOUS | Status: AC | PRN
  Administered 2011-10-28: 25 via INTRAVENOUS

## 2011-12-04 ENCOUNTER — Encounter (INDEPENDENT_AMBULATORY_CARE_PROVIDER_SITE_OTHER): Payer: Self-pay

## 2011-12-08 ENCOUNTER — Encounter (INDEPENDENT_AMBULATORY_CARE_PROVIDER_SITE_OTHER): Payer: Self-pay

## 2011-12-30 ENCOUNTER — Encounter (INDEPENDENT_AMBULATORY_CARE_PROVIDER_SITE_OTHER): Payer: Self-pay | Admitting: General Surgery

## 2012-01-06 ENCOUNTER — Other Ambulatory Visit (INDEPENDENT_AMBULATORY_CARE_PROVIDER_SITE_OTHER): Payer: Self-pay | Admitting: General Surgery

## 2012-01-06 ENCOUNTER — Encounter (INDEPENDENT_AMBULATORY_CARE_PROVIDER_SITE_OTHER): Payer: Self-pay | Admitting: Surgery

## 2012-01-06 ENCOUNTER — Ambulatory Visit (INDEPENDENT_AMBULATORY_CARE_PROVIDER_SITE_OTHER): Payer: Medicare Other | Admitting: Surgery

## 2012-01-06 DIAGNOSIS — E213 Hyperparathyroidism, unspecified: Secondary | ICD-10-CM

## 2012-01-06 DIAGNOSIS — Z8774 Personal history of (corrected) congenital malformations of heart and circulatory system: Secondary | ICD-10-CM | POA: Insufficient documentation

## 2012-01-06 DIAGNOSIS — Z9889 Other specified postprocedural states: Secondary | ICD-10-CM

## 2012-01-06 NOTE — Progress Notes (Signed)
Chief Complaint:  Hypercalcemia (11.2)  History of Present Illness:  Amber Cook is an 66 y.o. 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  She has seen Dr. Karl Luke had and his note indicates that she has a calcium level of 11.2. She forgot to have her lab work drawn recently so we will recheck her calcium level and parathyroid hormone level. In addition following order a sestamibi scan to try to localize the site of her adenoma. I explained this to her.  Past Medical History  Diagnosis Date  . GERD (gastroesophageal reflux disease)   . CHF (congestive heart failure)   . Heart murmur   . Hyperlipidemia   . Hypertension   . Neuromuscular disorder   . Thyroid disease     Past Surgical History  Procedure Date  . Tonsillectomy and adenoidectomy     Current Outpatient Prescriptions  Medication Sig Dispense Refill  . aspirin 325 MG buffered tablet Take 325 mg by mouth daily.      . colesevelam (WELCHOL) 625 MG tablet Take 1,875 mg by mouth 2 (two) times daily with a meal.      . escitalopram (LEXAPRO) 10 MG tablet Take 10 mg by mouth daily.      . furosemide (LASIX) 40 MG tablet       . levothyroxine (SYNTHROID, LEVOTHROID) 125 MCG tablet Take 125 mcg by mouth daily.      . meloxicam (MOBIC) 15 MG tablet Take 15 mg by mouth daily.      . metoprolol tartrate (LOPRESSOR) 25 MG tablet Take 25 mg by mouth 2 (two) times daily.      . Multiple Vitamins-Minerals (CENTRUM SILVER PO) Take by mouth.      . potassium chloride (K-DUR) 10 MEQ tablet Take 10 mEq by mouth 2 (two) times daily.      . traMADol (ULTRAM) 50 MG tablet Take 50 mg by mouth every 6 (six) hours as needed.       Erythromycin; Lidocaine; Prilosec; and Vicodin Family History  Problem Relation Age of Onset  . Cancer Mother     leukemia  . Cancer Maternal Uncle     leukemia  . Cancer Maternal Grandfather     stomach   Social History:   reports that she  has never smoked. She has never used smokeless tobacco. She reports that she does not drink alcohol or use illicit drugs.   REVIEW OF SYSTEMS - PERTINENT POSITIVES ONLY: She has had some problems with pain in her legs thought related to statins. She had problems with right heart failure secondary to an atrial septal defect which was repaired by a Gerhardt.  Physical Exam:   Blood pressure 118/70, pulse 80, temperature 97.1 F (36.2 C), temperature source Temporal, resp. rate 20, height 5\' 6"  (1.676 m), weight 284 lb 6.4 oz (129.003 kg). Body mass index is 45.90 kg/(m^2).  Gen:  WDWN WF NAD  Neurological: Alert and oriented to person, place, and time. Motor and sensory function is grossly intact  Head: Normocephalic and atraumatic.  Eyes: Conjunctivae are normal. Pupils are equal, round, and reactive to light. No scleral icterus.  Neck: Normal range of motion. Neck supple. No tracheal deviation or thyromegaly present.  Cardiovascular:  SR without murmurs or gallops.  No carotid bruits Respiratory: Effort normal.  No respiratory distress. No chest wall tenderness. Breath sounds normal.  No wheezes, rales or rhonchi.  Abdomen: nontender GU: Musculoskeletal:  Normal range of motion. Extremities are nontender. No cyanosis, edema or clubbing noted Lymphadenopathy: No cervical, preauricular, postauricular or axillary adenopathy is present Skin: Skin is warm and dry. No rash noted. No diaphoresis. No erythema. No pallor. Pscyh: Normal mood and affect. Behavior is normal. Judgment and thought content normal.   LABORATORY RESULTS: No results found for this or any previous visit (from the past 48 hour(s)).  RADIOLOGY RESULTS: No results found.  Problem List: There is no problem list on file for this patient.   Assessment & Plan: Primary hyperparathyroidism we'll obtain sestamibi scan and repeat her parathyroid hormone level and calcium level. We'll see her back after that    Matt B.  Daphine Deutscher, MD, [facility] [phone] beeper [phone]  01/06/2012 3:32 PM

## 2012-01-06 NOTE — Patient Instructions (Signed)
Parathyroidectomy A parathyroidectomy is surgery to remove one or more parathyroid glands. These glands produce a hormone (parathyroid hormone) that helps control the level of calcium in your body. The glands are very small, about the size of a pea. They are located in your neck, close to your thyroid gland and your Adam's apple. Most people (85%) have four parathyroid glands,some people may have one or two more than that. Hyperparathyroidism is when too much parathyroid hormone is being produced. Usually this is caused by one of the parathyroid glands becoming enlarged, but it can also be caused by more than one of the glands. Hyperparathyroidism is found during blood tests that show high calcium in the blood. Parathyroid hormone levels will also be elevated. Cancer also can cause hyperparathyroidism, but this is rare. For the most common type of hyperparathyroidism, the treatment is surgical removal of the parathyroid gland that is enlarged. For patients with kidney failure and hyperparathyroidism, other treatment will be tried before surgery is done on the parathyroid.  Many times x-ray studies are done to find out which parathyroid gland or glands is malfunctioning. The decision about the best treatment for hyperparathyroidism is between the patient, their primary doctor, an endocrinologist, and a surgeon experienced in parathyroid surgery. LET YOUR CAREGIVER KNOW ABOUT:  Any allergies.  All medications you are taking, including:  Herbs, eyedrops, over-the-counter medications and creams.  Blood thinners (anticoagulants), aspirin or other drugs that could affect blood clotting.  Use of steroids (by mouth or as creams).  Previous problems with anesthetics, including local anesthetics.  Possibility of pregnancy, if this applies.  Any history of blood clots.  Any history of bleeding or other blood problems.  Previous surgery.  Smoking history.  Other health problems. RISKS AND  COMPLICATIONS   Short-term possibilities include:  Excessive bleeding.  Pain.  Infection near the incision.  Slow healing.  Pooling of blood under the wound (hematoma).  Damage to nerves in your neck.  Blood clots.  Difficulty breathing. This is very rare. It also is almost always temporary.  Longer-term possibilities include:  Scarring.  Skin damage.  Damage to blood vessels in the area.  Need for additional surgery.  A hoarse or weak voice. This is usually temporary. It can be the result of nerve damage.  Development of hypoparathyroidism. This means you are not making enough parathyroid hormone. It is rare. If it occurs, you will need to take calcium supplements daily. BEFORE THE PROCEDURE  Sometimes the surgery is done on an outpatient basis. This means you could go home the same day as your surgery. Other times, people need to stay in the hospital overnight. Ask your surgeon what you should expect.  If your surgery will be an outpatient procedure, arrange for someone to drive you home after the surgery.  Two weeks before your surgery, stop using aspirin and non-steroidal anti-inflammatory drugs (NSAID's) for pain relief. This includes prescription drugs and over-the-counter drugs such as ibuprofen and naproxen. Also stop taking vitamin E.  If you take blood-thinners, ask your healthcare provider when you should stop taking them.  Do not eat or drink for about 8 hours before your surgery.  You might be asked to shower or wash with a special antibacterial soap before the procedure.  Arrive at least an hour before the surgery, or whenever your surgeon recommends. This will give you time to check in and fill out any needed paperwork. PROCEDURE  The preparation:  You will change into a hospital gown.  You  will be given an IV. A needle will be inserted in your arm. Medication will be able to flow directly into your body through this needle.  You might be given a  sedative to help you relax.  You will be given a drug that puts you to sleep during the surgery (general anesthetic).  The procedure:  Once you are asleep, the surgeon will make a small cut (incision) in your lower neck. Ask your surgeon where the incision will be.  The surgeon will look for the gland(s) that are not working well. Often a tissue sample from a gland is used to determine this.  Any glands that are not working well will be removed.  The surgeon will close the incision with stitches, often these are hidden under the skin. AFTER THE PROCEDURE  You will stay in a recovery area until the anesthesia has worn off. Your blood pressure and heart rate will be checked.  If your surgery was an outpatient procedure, you will go home the same day.  If you need to stay in the hospital, you will be moved to a hospital room. You will probably stay for two to three days. This will depend on how quickly you recover.  While you are in the hospital, your blood will be tested to check the calcium levels in your body. HOME CARE INSTRUCTIONS   Take any medication that your surgeon prescribes. Follow the directions carefully. Take all of the medication.  Ask your surgeon whether you can take over-the-counter medicines for pain, discomfort or fever. Do not take aspirin unless your healthcare provider says to. Aspirin increases the chances of bleeding.  Do not get the wound wet for the first few days after surgery (or until the surgeon tells you it is OK). SEEK MEDICAL CARE IF:   You notice blood or fluid leaking from the wound, or it becomes red or swollen.  You have trouble breathing.  You have trouble speaking.  You become nauseous or throw up for more than two days after the surgery.  You develop a fever of more than 100.5 F (38.1 C). SEEK IMMEDIATE MEDICAL CARE IF:   Breathing becomes more difficult.  You develop a fever of 102.0 F (38.9 C) or higher. Document Released:  06/05/2008 Document Revised: 06/01/2011 Document Reviewed: 06/05/2008 [facility] Patient Information 2013 [place], [place]. Hypercalcemia Hypercalcemia means the calcium in your blood is too high. A level above 10.5 milligrams per deciliter of blood is considered high. Calcium in our blood is important for the control of many things, such as:  Blood clotting.  Conducting of nerve impulses.  Muscle contraction.  Maintaining teeth and bone health.  Other body functions. In the bloodstream, calcium maintains a constant balance with another mineral, phosphate. Calcium is absorbed into the body through the small intestine. This is helped by Vitamin D. Calcium levels are maintained mostly by vitamin D and a hormone (parathyroid hormone). But the kidneys also help. Hypercalcemia can happen when the concentration of calcium is too high for the kidneys to maintain balance. The body maintains a balance between the calcium we eat and the calcium already in our body. If calcium intake is increased or we cannot use calcium properly, there may be problems. Some common sources of calcium are:   Dairy products.  Nuts.  Eggs.  Whole grains.  Legumes.  Green leafy vegetables. CAUSES There are many causes of this condition, but some common ones are:  Hyperparathyroidism. This is an over activity of the  parathyroid gland.  Cancers of the breast, kidney, lung, head and neck are common causes of calcium increases.  Medications that cause you to urinate more often (diuretics), nausea, vomiting and diarrhea also increase the calcium in the blood.  Overuse of calcium-containing antacids. SYMPTOMS  Many patients with mild hypercalcemia have no symptoms. For those with symptoms common problems include:  Loss of appetite.  Constipation.  Increased thirst.  Heart rhythm changes.  Abnormal thinking.  Nausea.  Abdominal pain.  Kidney stones.  Mood swings.  Coma and death when  severe.  Vomiting.  Increased urination.  High blood pressure.  Confusion. DIAGNOSIS   Your caregiver will do a medical history and perform a physical exam on you.  Calcium and parathyroid hormone (PTH) may be measured with a blood test. TREATMENT   The treatment depends on the calcium level and what is causing the higher level. Hypercalcemia can be lifethreatening. Fast lowering of the calcium level may be necessary.  With normal kidney function, fluids can be given by vein to clear the excess calcium. Hemodialysis works well to reduce dangerous calcium levels if there is poor kidney function. This is a procedure in which a machine is used to filter out unwanted substances. The blood is then returned to the body.  Drugs, such as diuretics, can be given after adequate fluid intake is established. These medications help the kidneys get rid of extra calcium. Drugs that lessen (inhibit) bone loss are helpful in gaining long-term control. Phosphate pills help lower high calcium levels caused by a low supply of phosphate. Anti-inflammatory agents such as steroids are helpful with some cancers and toxic levels of vitamin D.  Treatment of the underlying cause of the hypercalcemia will also correct the imbalance. Hyperparathyroidism is usually treated by surgical removal of one or more of the parathyroid glands and any tissue, other than the glands themselves, that is producing too much hormone.  The hypercalcemia caused by cancer is difficult to treat without controlling the cancer. Symptoms can be improved with fluids and drug therapy as outlined above. PROGNOSIS   Surgery to remove the parathyroid glands is usually successful. This also depends on the amount of damage to the kidneys and whether or not it can be treated.  Mild hypercalcemia can be controlled with good fluid intake and the use of effective medications.  Hypercalcemia often develops as a late complication of cancer. The  expected outlook is poor without effective anticancer therapy. PREVENTION   If you are at risk for developing hypercalcemia, be familiar with early symptoms. Report these to your caregiver.  Good fluid intake (up to four quarts of liquid a day if possible) is helpful.  Try to control nausea and vomiting, and treat fevers to avoid dehydration.  Lowering the amount of calcium in your diet is not necessary. High blood calcium reduces absorption of calcium in the intestine.  Stay as active as possible. SEEK IMMEDIATE MEDICAL CARE IF:   You develop chest pain, sweating, or shortness of breath.  You get confused, feel faint or pass out.  You develop severe nausea and vomiting. MAKE SURE YOU:   Understand these instructions.  Will watch your condition.  Will get help right away if you are not doing well or get worse. Document Released: 05/23/2004 Document Revised: 06/01/2011 Document Reviewed: 03/04/2010 [facility] Patient Information 2013 [place], [place].

## 2012-01-07 LAB — PTH, INTACT AND CALCIUM: PTH: 166.3 pg/mL — ABNORMAL HIGH (ref 14.0–72.0)

## 2012-01-11 ENCOUNTER — Telehealth (INDEPENDENT_AMBULATORY_CARE_PROVIDER_SITE_OTHER): Payer: Self-pay | Admitting: General Surgery

## 2012-01-11 NOTE — Telephone Encounter (Signed)
Called pt and informed her that we are repeating the scan so we can compare.  She said that was fine.

## 2012-01-11 NOTE — Telephone Encounter (Signed)
Message copied by Littie Deeds on Mon Jan 11, 2012  5:52 PM ------      Message from: Zacarias Pontes      Created: Mon Jan 11, 2012  3:12 PM       Pt says that she had a parathyroid test ordered by Dr Juleen China and she wants to know if the one that Dr MM has ordered is the same test..please call her back at [phone]

## 2012-01-14 ENCOUNTER — Encounter (HOSPITAL_COMMUNITY)
Admission: RE | Admit: 2012-01-14 | Discharge: 2012-01-14 | Disposition: A | Discharge status: Home / Self Care | Payer: Medicare Other | Source: Ambulatory Visit | Attending: Surgery | Admitting: Surgery

## 2012-01-14 DIAGNOSIS — E213 Hyperparathyroidism, unspecified: Secondary | ICD-10-CM | POA: Insufficient documentation

## 2012-01-14 MED ORDER — TECHNETIUM TC 99M SESTAMIBI GENERIC - CARDIOLITE
25.0000 | Freq: Once | INTRAVENOUS | Status: AC | PRN
  Administered 2012-01-14: 25 via INTRAVENOUS

## 2012-01-15 ENCOUNTER — Encounter (INDEPENDENT_AMBULATORY_CARE_PROVIDER_SITE_OTHER): Payer: Self-pay

## 2012-01-20 ENCOUNTER — Encounter (INDEPENDENT_AMBULATORY_CARE_PROVIDER_SITE_OTHER): Payer: Self-pay | Admitting: Surgery

## 2012-01-20 ENCOUNTER — Ambulatory Visit (INDEPENDENT_AMBULATORY_CARE_PROVIDER_SITE_OTHER): Payer: Medicare Other | Admitting: Surgery

## 2012-01-20 VITALS — BP 162/74 | HR 64 | Temp 97.4°F | Resp 20 | Ht 66.0 in | Wt 285.4 lb

## 2012-01-20 DIAGNOSIS — D351 Benign neoplasm of parathyroid gland: Secondary | ICD-10-CM

## 2012-01-20 NOTE — Patient Instructions (Addendum)
Thanks for your patience.  If you need further assistance after leaving the office, please call our office and speak with a [facility] nurse.  [phone].  If you want to leave a message for Dr. Shontia Gillooly, please call his office phone at [phone]. 

## 2012-01-20 NOTE — Progress Notes (Signed)
Chief Complaint:  Elevated calcium and PTH; ectopic parathyroid on nuclear medicine scan.  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 Her parathyroid hormone level is doubled at about 160 and her calcium was still 10.5. Previously it had been over 11. Her nuclear medicine scan this time is dating a while not definitive there was a focus in the midline. On examination and this looks to be in the midline and this is what is lighting up on the scan. We discussed various management strategies the we'll plan to move ahead with neck exploration looking for an ectopic PARAthyroid adenoma  Past Medical History  Diagnosis Date  . GERD (gastroesophageal reflux disease)   . CHF (congestive heart failure)   . Heart murmur   . Hyperlipidemia   . Hypertension   . Neuromuscular disorder   . Thyroid disease     Past Surgical History  Procedure Date  . Tonsillectomy and adenoidectomy     Current Outpatient Prescriptions  Medication Sig Dispense Refill  . aspirin 325 MG buffered tablet Take 325 mg by mouth daily.      . colesevelam (WELCHOL) 625 MG tablet Take 1,875 mg by mouth 2 (two) times daily with a meal.      . escitalopram (LEXAPRO) 10 MG tablet Take 10 mg by mouth daily.      . furosemide (LASIX) 40 MG tablet       . KLOR-CON M10 10 MEQ tablet       . levothyroxine (SYNTHROID, LEVOTHROID) 125 MCG tablet Take 125 mcg by mouth daily.      . meloxicam (MOBIC) 15 MG tablet Take 15 mg by mouth daily.      . metoprolol succinate (TOPROL-XL) 25 MG 24 hr tablet       . metoprolol tartrate (LOPRESSOR) 25 MG tablet Take 25 mg by mouth 2 (two) times daily.      . Multiple Vitamins-Minerals (CENTRUM SILVER PO) Take by mouth.      . potassium chloride (K-DUR) 10 MEQ tablet Take 10 mEq by mouth 2 (two) times daily.      . traMADol (ULTRAM) 50 MG tablet Take 50 mg by mouth every 6 (six) hours as needed.       Erythromycin; Lidocaine;  Prilosec; and Vicodin Family History  Problem Relation Age of Onset  . Cancer Mother     leukemia  . Cancer Maternal Uncle     leukemia  . Cancer Maternal Grandfather     stomach   Social History:   reports that she has never smoked. She has never used smokeless tobacco. She reports that she does not drink alcohol or use illicit drugs.   REVIEW OF SYSTEMS - PERTINENT POSITIVES ONLY: Not contributory  Physical Exam:   Blood pressure 162/74, pulse 64, temperature 97.4 F (36.3 C), temperature source Temporal, resp. rate 20, height 5\' 6"  (1.676 m), weight 285 lb 6.4 oz (129.457 kg). Body mass index is 46.06 kg/(m^2).  Gen:  WDWN WF NAD  Neurological: Alert and oriented to person, place, and time. Motor and sensory function is grossly intact  Head: Normocephalic and atraumatic.  Eyes: Conjunctivae are normal. Pupils are equal, round, and reactive to light. No scleral icterus.  Neck: Normal range of motion. Neck supple. No tracheal deviation or thyromegaly present.  Cardiovascular:  SR without murmurs or gallops.  No carotid bruits Chest with sternotomy from ASD repair Respiratory: Effort normal.  No respiratory distress. No chest wall tenderness. Breath sounds normal.  No wheezes, rales or rhonchi.  Abdomen:  nontender but obese GU: Musculoskeletal: Normal range of motion. Extremities are nontender. No cyanosis, edema or clubbing noted Lymphadenopathy: No cervical, preauricular, postauricular or axillary adenopathy is present Skin: Skin is warm and dry. No rash noted. No diaphoresis. No erythema. No pallor. Pscyh: Normal mood and affect. Behavior is normal. Judgment and thought content normal.   LABORATORY RESULTS: No results found for this or any previous visit (from the past 48 hour(s)).  RADIOLOGY RESULTS: No results found.  Problem List: Patient Active Problem List  Diagnosis  . Hypercalcemia  . Status post patch closure of ASD    Assessment & Plan: Ectopic parathyroid  adenoma noted in the midline.  Plan neck exploration for parathyroid adenoma    Matt B. Daphine Deutscher, MD, [facility] [phone] beeper [phone]  01/20/2012 4:31 PM

## 2012-03-01 ENCOUNTER — Ambulatory Visit: Admit: 2012-03-01 | Payer: Self-pay | Admitting: Surgery

## 2012-03-01 SURGERY — EXPLORATION, PARATHYROID
Anesthesia: General

## 2012-03-25 ENCOUNTER — Other Ambulatory Visit (INDEPENDENT_AMBULATORY_CARE_PROVIDER_SITE_OTHER): Payer: Self-pay | Admitting: Surgery

## 2012-03-28 ENCOUNTER — Encounter (HOSPITAL_COMMUNITY): Payer: Self-pay | Admitting: Pharmacy Technician

## 2012-03-30 ENCOUNTER — Other Ambulatory Visit (HOSPITAL_COMMUNITY): Payer: Self-pay | Admitting: Cardiovascular Disease

## 2012-03-30 DIAGNOSIS — Z8774 Personal history of (corrected) congenital malformations of heart and circulatory system: Secondary | ICD-10-CM

## 2012-03-31 ENCOUNTER — Ambulatory Visit (HOSPITAL_COMMUNITY)
Admission: RE | Admit: 2012-03-31 | Discharge: 2012-03-31 | Disposition: A | Discharge status: Home / Self Care | Payer: Medicare Other | Source: Ambulatory Visit | Attending: Cardiovascular Disease | Admitting: Cardiovascular Disease

## 2012-03-31 DIAGNOSIS — I5189 Other ill-defined heart diseases: Secondary | ICD-10-CM

## 2012-03-31 DIAGNOSIS — Q211 Atrial septal defect: Secondary | ICD-10-CM | POA: Insufficient documentation

## 2012-03-31 DIAGNOSIS — I369 Nonrheumatic tricuspid valve disorder, unspecified: Secondary | ICD-10-CM | POA: Insufficient documentation

## 2012-03-31 DIAGNOSIS — Z8774 Personal history of (corrected) congenital malformations of heart and circulatory system: Secondary | ICD-10-CM

## 2012-03-31 DIAGNOSIS — I059 Rheumatic mitral valve disease, unspecified: Secondary | ICD-10-CM | POA: Insufficient documentation

## 2012-03-31 DIAGNOSIS — Q2111 Secundum atrial septal defect: Secondary | ICD-10-CM | POA: Insufficient documentation

## 2012-03-31 HISTORY — PX: OTHER SURGICAL HISTORY: SHX169

## 2012-03-31 HISTORY — DX: Other ill-defined heart diseases: I51.89

## 2012-03-31 NOTE — Progress Notes (Signed)
[facility]   2D echo completed 03/31/2012.   Cindy Maxx Calaway, RDCS   

## 2012-04-01 ENCOUNTER — Encounter (HOSPITAL_COMMUNITY)
Admission: RE | Admit: 2012-04-01 | Discharge: 2012-04-01 | Disposition: A | Discharge status: Home / Self Care | Payer: Medicare Other | Source: Ambulatory Visit | Attending: Surgery | Admitting: Surgery

## 2012-04-01 ENCOUNTER — Encounter (HOSPITAL_COMMUNITY)
Admission: RE | Admit: 2012-04-01 | Discharge: 2012-04-01 | Disposition: A | Discharge status: Home / Self Care | Payer: Medicare Other | Source: Ambulatory Visit | Attending: Anesthesiology | Admitting: Anesthesiology

## 2012-04-01 ENCOUNTER — Encounter (HOSPITAL_COMMUNITY): Payer: Self-pay

## 2012-04-01 HISTORY — DX: Acute candidiasis of vulva and vagina: B37.31

## 2012-04-01 HISTORY — DX: Personal history of other diseases of the respiratory system: Z87.09

## 2012-04-01 HISTORY — DX: Sleep apnea, unspecified: G47.30

## 2012-04-01 HISTORY — DX: Candidiasis of vulva and vagina: B37.3

## 2012-04-01 HISTORY — DX: Type 2 diabetes mellitus without complications: E11.9

## 2012-04-01 HISTORY — DX: Frequency of micturition: R35.0

## 2012-04-01 HISTORY — DX: Unspecified osteoarthritis, unspecified site: M19.90

## 2012-04-01 HISTORY — DX: Major depressive disorder, single episode, unspecified: F32.9

## 2012-04-01 HISTORY — DX: Other hemorrhoids: K64.8

## 2012-04-01 HISTORY — DX: Noninfective gastroenteritis and colitis, unspecified: K52.9

## 2012-04-01 HISTORY — DX: Depression, unspecified: F32.A

## 2012-04-01 HISTORY — DX: Hypothyroidism, unspecified: E03.9

## 2012-04-01 HISTORY — DX: Pneumonia, unspecified organism: J18.9

## 2012-04-01 HISTORY — DX: Hypercalcemia: E83.52

## 2012-04-01 HISTORY — DX: Polyneuropathy, unspecified: G62.9

## 2012-04-01 HISTORY — DX: Other specified abnormal findings of blood chemistry: R79.89

## 2012-04-01 HISTORY — DX: Endocrine disorder, unspecified: E34.9

## 2012-04-01 LAB — CBC
HCT: 44.2 % (ref 36.0–46.0)
MCV: 95.9 fL (ref 78.0–100.0)
RBC: 4.61 MIL/uL (ref 3.87–5.11)
RDW: 13 % (ref 11.5–15.5)
WBC: 6.6 10*3/uL (ref 4.0–10.5)

## 2012-04-01 LAB — COMPREHENSIVE METABOLIC PANEL
BUN: 17 mg/dL (ref 6–23)
CO2: 29 mEq/L (ref 19–32)
Chloride: 101 mEq/L (ref 96–112)
Creatinine, Ser: 0.88 mg/dL (ref 0.50–1.10)
GFR calc Af Amer: 77 mL/min — ABNORMAL LOW (ref >90)
GFR calc non Af Amer: 66 mL/min — ABNORMAL LOW (ref >90)
Total Bilirubin: 0.4 mg/dL (ref 0.3–1.2)

## 2012-04-01 LAB — LIPID PANEL
Cholesterol: 279 mg/dL — ABNORMAL HIGH (ref 0–200)
HDL: 45 mg/dL (ref >39)
LDL Cholesterol: 184 mg/dL — ABNORMAL HIGH (ref 0–99)
Triglycerides: 267 mg/dL — ABNORMAL HIGH (ref <150)
Triglycerides: 256 mg/dL — ABNORMAL HIGH (ref <150)
VLDL: 53 mg/dL — ABNORMAL HIGH (ref 0–40)

## 2012-04-01 MED ORDER — CHLORHEXIDINE GLUCONATE 4 % EX LIQD: 1.0000 "application " | Freq: Once | CUTANEOUS | Status: DC

## 2012-04-01 NOTE — Pre-Procedure Instructions (Signed)
Amber Cook  04/01/2012   Your procedure is scheduled on:  Tues, Jan 14 @ 7:30 AM  Report to [facility] at  5:30 AM  Call this number if you have problems the morning of surgery: [phone]   Remember:   Do not eat food or drink liquids after midnight.   Take these medicines the morning of surgery with A SIP OF WATER: Escitalopram(Lexapro),Synthroid(Levothyroxine),Metoprolol(Lopressor),and Tramadol(Ultram-if needed)   Do not wear jewelry, make-up or nail polish.  Do not wear lotions, powders, or perfumes. You may wear deodorant.  Do not shave 48 hours prior to surgery.   Do not bring valuables to the hospital.  Contacts, dentures or bridgework may not be worn into surgery.  Leave suitcase in the car. After surgery it may be brought to your room.  For patients admitted to the hospital, checkout time is 11:00 AM the day of  discharge.   Patients discharged the day of surgery will not be allowed to drive  home.  Special Instructions: Shower using CHG 2 nights before surgery and the night before surgery.  If you shower the day of surgery use CHG.  Use special wash - you have one bottle of CHG for all showers.  You should use approximately 1/3 of the bottle for each shower.   Please read over the following fact sheets that you were given: Pain Booklet, Coughing and Deep Breathing, MRSA Information and Surgical Site Infection Prevention

## 2012-04-01 NOTE — Progress Notes (Signed)
Dr.C with [facility]  is cardiologist--follows up yearly  Echo in epic from 2008 2D echo with bubble study on 03/31/12 to request from [facility] Stress test done in 2008-to request from [facility] MD is Dr.Kohut   EKG done 03/31/12-to request from [place]  Denies CXR being done within past yr

## 2012-04-04 ENCOUNTER — Encounter (INDEPENDENT_AMBULATORY_CARE_PROVIDER_SITE_OTHER): Payer: Self-pay

## 2012-04-04 MED ORDER — DEXTROSE 5 % IV SOLN
3.0000 g | INTRAVENOUS | Status: AC
  Administered 2012-04-05: 3 g via INTRAVENOUS
  Filled 2012-04-04: qty 3000

## 2012-04-04 NOTE — H&P (Signed)
Chief Complaint: Elevated calcium and PTH; ectopic parathyroid on nuclear medicine scan.  History of Present Illness: Amber Cook is an 67 y.o. 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 Her parathyroid hormone level is doubled at about 160 and her calcium was still 10.5. Previously it had been over 11. Her nuclear medicine scan this time is dating a while not definitive there was a focus in the midline. On examination and this looks to be in the midline and this is what is lighting up on the scan. We discussed various management strategies the we'll plan to move ahead with neck exploration looking for an ectopic parathyroid adenoma  Past Medical History   Diagnosis  Date   .  GERD (gastroesophageal reflux disease)    .  CHF (congestive heart failure)    .  Heart murmur    .  Hyperlipidemia    .  Hypertension    .  Neuromuscular disorder    .  Thyroid disease     Past Surgical History   Procedure  Date   .  Tonsillectomy and adenoidectomy     Current Outpatient Prescriptions   Medication  Sig  Dispense  Refill   .  aspirin 325 MG buffered tablet  Take 325 mg by mouth daily.     .  colesevelam (WELCHOL) 625 MG tablet  Take 1,875 mg by mouth 2 (two) times daily with a meal.     .  escitalopram (LEXAPRO) 10 MG tablet  Take 10 mg by mouth daily.     .  furosemide (LASIX) 40 MG tablet      .  KLOR-CON M10 10 MEQ tablet      .  levothyroxine (SYNTHROID, LEVOTHROID) 125 MCG tablet  Take 125 mcg by mouth daily.     .  meloxicam (MOBIC) 15 MG tablet  Take 15 mg by mouth daily.     .  metoprolol succinate (TOPROL-XL) 25 MG 24 hr tablet      .  metoprolol tartrate (LOPRESSOR) 25 MG tablet  Take 25 mg by mouth 2 (two) times daily.     .  Multiple Vitamins-Minerals (CENTRUM SILVER PO)  Take by mouth.     .  potassium chloride (K-DUR) 10 MEQ tablet  Take 10 mEq by mouth 2 (two) times daily.     .  traMADol (ULTRAM) 50 MG tablet  Take 50 mg by mouth every 6 (six) hours as needed.       Erythromycin; Lidocaine; Prilosec; and Vicodin  Family History   Problem  Relation  Age of Onset   .  Cancer  Mother       leukemia    .  Cancer  Maternal Uncle       leukemia    .  Cancer  Maternal Grandfather       stomach   Social History: reports that she has never smoked. She has never used smokeless tobacco. She reports that she does not drink alcohol or use illicit drugs.  REVIEW OF SYSTEMS - PERTINENT POSITIVES ONLY:  Not contributory  Physical Exam:  Blood pressure 162/74, pulse 64, temperature 97.4 F (36.3 C), temperature source Temporal, resp. rate 20, height 5\' 6"  (1.676 m), weight 285 lb 6.4 oz (129.457 kg).  Body mass index is 46.06 kg/(m^2).  Gen: WDWN WF NAD  Neurological: Alert and oriented to person, place, and time. Motor and sensory function is grossly intact  Head: Normocephalic and atraumatic.  Eyes:  Conjunctivae are normal. Pupils are equal, round, and reactive to light. No scleral icterus.  Neck: Normal range of motion. Neck supple. No tracheal deviation or thyromegaly present.  Cardiovascular: SR without murmurs or gallops. No carotid bruits Chest with sternotomy from ASD repair  Respiratory: Effort normal. No respiratory distress. No chest wall tenderness. Breath sounds normal. No wheezes, rales or rhonchi.  Abdomen: nontender but obese  GU:  Musculoskeletal: Normal range of motion. Extremities are nontender. No cyanosis, edema or clubbing noted Lymphadenopathy: No cervical, preauricular, postauricular or axillary adenopathy is present Skin: Skin is warm and dry. No rash noted. No diaphoresis. No erythema. No pallor. Pscyh: Normal mood and affect. Behavior is normal. Judgment and thought content normal.  LABORATORY RESULTS:  No results found for this or any previous visit (from the past 48 hour(s)).  RADIOLOGY RESULTS:  No results found.  Problem List:  Patient Active Problem List   Diagnosis   .  Hypercalcemia   .  Status post patch closure of  ASD   Assessment & Plan:  Ectopic parathyroid adenoma noted in the midline. Plan neck exploration for parathyroid adenoma.  Will do at [employer] where she worked for years.   Matt B. Daphine Deutscher, MD, [facility]  [phone] beeper  [phone]

## 2012-04-05 ENCOUNTER — Encounter (HOSPITAL_COMMUNITY): Payer: Self-pay | Admitting: Surgery

## 2012-04-05 ENCOUNTER — Ambulatory Visit (HOSPITAL_COMMUNITY)
Admission: RE | Admit: 2012-04-05 | Discharge: 2012-04-07 | Disposition: A | Discharge status: Home / Self Care | Payer: Medicare Other | Source: Ambulatory Visit | Attending: Surgery | Admitting: Surgery

## 2012-04-05 ENCOUNTER — Encounter (HOSPITAL_COMMUNITY): Payer: Self-pay | Admitting: Certified Registered"

## 2012-04-05 ENCOUNTER — Ambulatory Visit (HOSPITAL_COMMUNITY): Payer: Medicare Other | Admitting: Certified Registered"

## 2012-04-05 ENCOUNTER — Encounter (HOSPITAL_COMMUNITY)
Admission: RE | Disposition: A | Discharge status: Home / Self Care | Payer: Self-pay | Source: Ambulatory Visit | Attending: Surgery

## 2012-04-05 DIAGNOSIS — E210 Primary hyperparathyroidism: Secondary | ICD-10-CM | POA: Insufficient documentation

## 2012-04-05 DIAGNOSIS — D351 Benign neoplasm of parathyroid gland: Secondary | ICD-10-CM

## 2012-04-05 DIAGNOSIS — I10 Essential (primary) hypertension: Secondary | ICD-10-CM | POA: Insufficient documentation

## 2012-04-05 DIAGNOSIS — E119 Type 2 diabetes mellitus without complications: Secondary | ICD-10-CM | POA: Insufficient documentation

## 2012-04-05 HISTORY — PX: PARATHYROID EXPLORATION: SHX732

## 2012-04-05 LAB — GLUCOSE, CAPILLARY: Glucose-Capillary: 120 mg/dL — ABNORMAL HIGH (ref 70–99)

## 2012-04-05 LAB — CREATININE, SERUM
GFR calc Af Amer: 78 mL/min — ABNORMAL LOW (ref >90)
GFR calc non Af Amer: 67 mL/min — ABNORMAL LOW (ref >90)

## 2012-04-05 LAB — CBC
HCT: 41.1 % (ref 36.0–46.0)
Hemoglobin: 13.7 g/dL (ref 12.0–15.0)
MCV: 94.9 fL (ref 78.0–100.0)
WBC: 8.5 10*3/uL (ref 4.0–10.5)

## 2012-04-05 SURGERY — EXPLORATION, PARATHYROID
Anesthesia: General | Site: Neck | Wound class: Clean

## 2012-04-05 MED ORDER — METOPROLOL SUCCINATE ER 25 MG PO TB24
25.0000 mg | ORAL_TABLET | Freq: Every day | ORAL | Status: DC
  Administered 2012-04-05 – 2012-04-07 (×3): 25 mg via ORAL
  Filled 2012-04-05 (×3): qty 1

## 2012-04-05 MED ORDER — FUROSEMIDE 40 MG PO TABS
40.0000 mg | ORAL_TABLET | Freq: Every day | ORAL | Status: DC | PRN
  Filled 2012-04-05: qty 1

## 2012-04-05 MED ORDER — FENTANYL CITRATE 0.05 MG/ML IJ SOLN
25.0000 ug | INTRAMUSCULAR | Status: DC | PRN
  Administered 2012-04-05 (×2): 50 ug via INTRAVENOUS

## 2012-04-05 MED ORDER — COLESEVELAM HCL 625 MG PO TABS
1875.0000 mg | ORAL_TABLET | Freq: Two times a day (BID) | ORAL | Status: DC
  Administered 2012-04-05 – 2012-04-07 (×4): 1875 mg via ORAL
  Filled 2012-04-05 (×6): qty 3

## 2012-04-05 MED ORDER — GLYCOPYRROLATE 0.2 MG/ML IJ SOLN
INTRAMUSCULAR | Status: DC | PRN
Start: 2012-04-05 — End: 2012-04-05
  Administered 2012-04-05: .8 mg via INTRAVENOUS

## 2012-04-05 MED ORDER — HEPARIN SODIUM (PORCINE) 5000 UNIT/ML IJ SOLN
5000.0000 [IU] | Freq: Three times a day (TID) | INTRAMUSCULAR | Status: DC
  Administered 2012-04-05 – 2012-04-07 (×5): 5000 [IU] via SUBCUTANEOUS
  Filled 2012-04-05 (×9): qty 1

## 2012-04-05 MED ORDER — ONDANSETRON HCL 4 MG/2ML IJ SOLN
INTRAMUSCULAR | Status: DC | PRN
  Administered 2012-04-05: 4 mg via INTRAVENOUS

## 2012-04-05 MED ORDER — ONDANSETRON HCL 4 MG/2ML IJ SOLN: 4.0000 mg | Freq: Four times a day (QID) | INTRAMUSCULAR | Status: DC | PRN

## 2012-04-05 MED ORDER — POTASSIUM CHLORIDE CRYS ER 10 MEQ PO TBCR
10.0000 meq | EXTENDED_RELEASE_TABLET | Freq: Every day | ORAL | Status: DC
  Administered 2012-04-05 – 2012-04-07 (×3): 10 meq via ORAL
  Filled 2012-04-05 (×3): qty 1

## 2012-04-05 MED ORDER — PHENYLEPHRINE HCL 10 MG/ML IJ SOLN
INTRAMUSCULAR | Status: DC | PRN
  Administered 2012-04-05: 120 ug via INTRAVENOUS
  Administered 2012-04-05: 40 ug via INTRAVENOUS

## 2012-04-05 MED ORDER — HEMOSTATIC AGENTS (NO CHARGE) OPTIME
TOPICAL | Status: DC | PRN
  Administered 2012-04-05: 1 via TOPICAL

## 2012-04-05 MED ORDER — 0.9 % SODIUM CHLORIDE (POUR BTL) OPTIME
TOPICAL | Status: DC | PRN
  Administered 2012-04-05: 1000 mL

## 2012-04-05 MED ORDER — TRAMADOL HCL 50 MG PO TABS
50.0000 mg | ORAL_TABLET | Freq: Four times a day (QID) | ORAL | Status: DC
  Administered 2012-04-05 – 2012-04-07 (×9): 50 mg via ORAL
  Filled 2012-04-05 (×12): qty 1

## 2012-04-05 MED ORDER — KCL IN DEXTROSE-NACL 20-5-0.45 MEQ/L-%-% IV SOLN
INTRAVENOUS | Status: DC
  Administered 2012-04-06 – 2012-04-07 (×2): via INTRAVENOUS
  Filled 2012-04-05 (×6): qty 1000

## 2012-04-05 MED ORDER — POTASSIUM CHLORIDE CRYS ER 10 MEQ PO TBCR
10.0000 meq | EXTENDED_RELEASE_TABLET | Freq: Every day | ORAL | Status: DC | PRN
  Filled 2012-04-05: qty 1

## 2012-04-05 MED ORDER — SUCCINYLCHOLINE CHLORIDE 20 MG/ML IJ SOLN
INTRAMUSCULAR | Status: DC | PRN
  Administered 2012-04-05: 140 mg via INTRAVENOUS

## 2012-04-05 MED ORDER — ESCITALOPRAM OXALATE 10 MG PO TABS
10.0000 mg | ORAL_TABLET | Freq: Every day | ORAL | Status: DC
  Administered 2012-04-05 – 2012-04-07 (×3): 10 mg via ORAL
  Filled 2012-04-05 (×3): qty 1

## 2012-04-05 MED ORDER — PROPOFOL 10 MG/ML IV BOLUS
INTRAVENOUS | Status: DC | PRN
  Administered 2012-04-05: 170 mg via INTRAVENOUS
  Administered 2012-04-05: 30 mg via INTRAVENOUS

## 2012-04-05 MED ORDER — LEVOTHYROXINE SODIUM 125 MCG PO TABS
125.0000 ug | ORAL_TABLET | Freq: Every day | ORAL | Status: DC
  Administered 2012-04-06 – 2012-04-07 (×2): 125 ug via ORAL
  Filled 2012-04-05 (×4): qty 1

## 2012-04-05 MED ORDER — NEOSTIGMINE METHYLSULFATE 1 MG/ML IJ SOLN
INTRAMUSCULAR | Status: DC | PRN
  Administered 2012-04-05: 5 mg via INTRAVENOUS

## 2012-04-05 MED ORDER — ROCURONIUM BROMIDE 100 MG/10ML IV SOLN
INTRAVENOUS | Status: DC | PRN
  Administered 2012-04-05: 30 mg via INTRAVENOUS
  Administered 2012-04-05: 20 mg via INTRAVENOUS

## 2012-04-05 MED ORDER — FENTANYL CITRATE 0.05 MG/ML IJ SOLN
INTRAMUSCULAR | Status: DC | PRN
  Administered 2012-04-05 (×2): 50 ug via INTRAVENOUS
  Administered 2012-04-05: 100 ug via INTRAVENOUS
  Administered 2012-04-05: 50 ug via INTRAVENOUS

## 2012-04-05 MED ORDER — FUROSEMIDE 40 MG PO TABS
40.0000 mg | ORAL_TABLET | Freq: Every day | ORAL | Status: DC
  Administered 2012-04-05 – 2012-04-07 (×3): 40 mg via ORAL
  Filled 2012-04-05 (×3): qty 1

## 2012-04-05 MED ORDER — FENTANYL CITRATE 0.05 MG/ML IJ SOLN
INTRAMUSCULAR | Status: AC
  Filled 2012-04-05: qty 2

## 2012-04-05 MED ORDER — ONDANSETRON HCL 4 MG PO TABS: 4.0000 mg | ORAL_TABLET | Freq: Four times a day (QID) | ORAL | Status: DC | PRN

## 2012-04-05 MED ORDER — ARTIFICIAL TEARS OP OINT
TOPICAL_OINTMENT | OPHTHALMIC | Status: DC | PRN
  Administered 2012-04-05: 1 via OPHTHALMIC

## 2012-04-05 MED ORDER — MIDAZOLAM HCL 5 MG/5ML IJ SOLN
INTRAMUSCULAR | Status: DC | PRN
  Administered 2012-04-05: 2 mg via INTRAVENOUS

## 2012-04-05 MED ORDER — FUROSEMIDE 40 MG PO TABS: 40.0000 mg | ORAL_TABLET | Freq: Every day | ORAL | Status: DC

## 2012-04-05 MED ORDER — HEPARIN SODIUM (PORCINE) 5000 UNIT/ML IJ SOLN
5000.0000 [IU] | Freq: Once | INTRAMUSCULAR | Status: AC
  Administered 2012-04-05: 5000 [IU] via SUBCUTANEOUS

## 2012-04-05 MED ORDER — LACTATED RINGERS IV SOLN
INTRAVENOUS | Status: DC | PRN
  Administered 2012-04-05 (×2): via INTRAVENOUS

## 2012-04-05 MED ORDER — HYDROMORPHONE HCL PF 1 MG/ML IJ SOLN
0.5000 mg | INTRAMUSCULAR | Status: DC | PRN
  Administered 2012-04-05 – 2012-04-06 (×3): 0.5 mg via INTRAVENOUS
  Filled 2012-04-05 (×3): qty 1

## 2012-04-05 MED ORDER — HEPARIN SODIUM (PORCINE) 5000 UNIT/ML IJ SOLN
INTRAMUSCULAR | Status: AC
  Administered 2012-04-05: 5000 [IU] via SUBCUTANEOUS
  Filled 2012-04-05: qty 1

## 2012-04-05 SURGICAL SUPPLY — 53 items
APL SKNCLS STERI-STRIP NONHPOA (GAUZE/BANDAGES/DRESSINGS) ×1
BENZOIN TINCTURE PRP APPL 2/3 (GAUZE/BANDAGES/DRESSINGS) ×2 IMPLANT
BLADE SURG 15 STRL LF DISP TIS (BLADE) ×1 IMPLANT
BLADE SURG 15 STRL SS (BLADE) ×2
CANISTER SUCTION 2500CC (MISCELLANEOUS) ×2 IMPLANT
CHLORAPREP W/TINT 10.5 ML (MISCELLANEOUS) ×2 IMPLANT
CLIP TI MEDIUM 6 (CLIP) ×4 IMPLANT
CLIP TI WIDE RED SMALL 6 (CLIP) ×4 IMPLANT
CLOTH BEACON ORANGE TIMEOUT ST (SAFETY) ×2 IMPLANT
CLSR STERI-STRIP ANTIMIC 1/2X4 (GAUZE/BANDAGES/DRESSINGS) ×2 IMPLANT
CONT SPEC 4OZ CLIKSEAL STRL BL (MISCELLANEOUS) ×4 IMPLANT
COVER SURGICAL LIGHT HANDLE (MISCELLANEOUS) ×2 IMPLANT
DRAPE PED LAPAROTOMY (DRAPES) ×2 IMPLANT
DRAPE UTILITY 15X26 W/TAPE STR (DRAPE) ×4 IMPLANT
ELECT CAUTERY BLADE 6.4 (BLADE) ×2 IMPLANT
ELECT REM PT RETURN 9FT ADLT (ELECTROSURGICAL) ×2
ELECTRODE REM PT RTRN 9FT ADLT (ELECTROSURGICAL) ×1 IMPLANT
GAUZE SPONGE 2X2 8PLY STRL LF (GAUZE/BANDAGES/DRESSINGS) ×1 IMPLANT
GAUZE SPONGE 4X4 16PLY XRAY LF (GAUZE/BANDAGES/DRESSINGS) ×4 IMPLANT
GLOVE BIO SURGEON STRL SZ8 (GLOVE) ×2 IMPLANT
GLOVE BIOGEL PI IND STRL 6.5 (GLOVE) ×1 IMPLANT
GLOVE BIOGEL PI IND STRL 7.0 (GLOVE) ×2 IMPLANT
GLOVE BIOGEL PI INDICATOR 6.5 (GLOVE) ×1
GLOVE BIOGEL PI INDICATOR 7.0 (GLOVE) ×2
GLOVE SS BIOGEL STRL SZ 6.5 (GLOVE) ×2 IMPLANT
GLOVE SUPERSENSE BIOGEL SZ 6.5 (GLOVE) ×2
GLOVE SURG ORTHO 8.0 STRL STRW (GLOVE) ×2 IMPLANT
GOWN STRL NON-REIN LRG LVL3 (GOWN DISPOSABLE) ×4 IMPLANT
GOWN STRL REIN XL XLG (GOWN DISPOSABLE) ×2 IMPLANT
HEMOSTAT SURGICEL 2X4 FIBR (HEMOSTASIS) ×2 IMPLANT
KIT BASIN OR (CUSTOM PROCEDURE TRAY) ×2 IMPLANT
KIT ROOM TURNOVER OR (KITS) ×2 IMPLANT
NEEDLE HYPO 25GX1X1/2 BEV (NEEDLE) IMPLANT
NS IRRIG 1000ML POUR BTL (IV SOLUTION) ×2 IMPLANT
PACK SURGICAL SETUP 50X90 (CUSTOM PROCEDURE TRAY) ×2 IMPLANT
PAD ARMBOARD 7.5X6 YLW CONV (MISCELLANEOUS) ×4 IMPLANT
PENCIL BUTTON HOLSTER BLD 10FT (ELECTRODE) ×2 IMPLANT
SPONGE GAUZE 2X2 STER 10/PKG (GAUZE/BANDAGES/DRESSINGS) ×1
SPONGE INTESTINAL PEANUT (DISPOSABLE) ×4 IMPLANT
STRIP CLOSURE SKIN 1/2X4 (GAUZE/BANDAGES/DRESSINGS) IMPLANT
SUT MNCRL AB 4-0 PS2 18 (SUTURE) ×2 IMPLANT
SUT SILK 2 0 (SUTURE)
SUT SILK 2-0 18XBRD TIE 12 (SUTURE) IMPLANT
SUT SILK 3 0 (SUTURE) ×2
SUT SILK 3-0 18XBRD TIE 12 (SUTURE) ×1 IMPLANT
SUT VIC AB 3-0 SH 18 (SUTURE) ×2 IMPLANT
SUT VIC AB 4-0 SH 18 (SUTURE) ×2 IMPLANT
SYR BULB IRRIGATION 50ML (SYRINGE) ×2 IMPLANT
SYR CONTROL 10ML LL (SYRINGE) IMPLANT
TAPE CLOTH SURG 4X10 WHT LF (GAUZE/BANDAGES/DRESSINGS) ×2 IMPLANT
TOWEL OR 17X24 6PK STRL BLUE (TOWEL DISPOSABLE) ×2 IMPLANT
TOWEL OR 17X26 10 PK STRL BLUE (TOWEL DISPOSABLE) ×2 IMPLANT
TUBE CONNECTING 12X1/4 (SUCTIONS) ×2 IMPLANT

## 2012-04-05 NOTE — Interval H&P Note (Signed)
History and Physical Interval Note:  04/05/2012 7:28 AM  Amber Cook  has presented today for surgery, with the diagnosis of parathyroid adenoma   The various methods of treatment have been discussed with the patient and family. After consideration of risks, benefits and other options for treatment, the patient has consented to  Procedure(s) (LRB) with comments: PARATHYROID EXPLORATION (N/A) as a surgical intervention .  The patient's history has been reviewed, patient examined, no change in status, stable for surgery.  I have reviewed the patient's chart and labs.  Questions were answered to the patient's satisfaction.     Deosha Werden B

## 2012-04-05 NOTE — Anesthesia Postprocedure Evaluation (Signed)
Anesthesia Post Note  Patient: Amber Cook  Procedure(s) Performed: Procedure(s) (LRB): PARATHYROID EXPLORATION (N/A)  Anesthesia type: General  Patient location: PACU  Post pain: Pain level controlled and Adequate analgesia  Post assessment: Post-op Vital signs reviewed, Patient's Cardiovascular Status Stable, Respiratory Function Stable, Patent Airway and Pain level controlled  Last Vitals:  Filed Vitals:   04/05/12 1015  BP:   Pulse: 93  Temp:   Resp: 16    Post vital signs: Reviewed and stable  Level of consciousness: awake, alert  and oriented  Complications: No apparent anesthesia complications

## 2012-04-05 NOTE — Anesthesia Procedure Notes (Signed)
Procedure Name: Intubation Date/Time: 04/05/2012 7:46 AM Performed by: Jefm Miles E Pre-anesthesia Checklist: Patient identified, Timeout performed, Emergency Drugs available, Suction available and Patient being monitored Patient Re-evaluated:Patient Re-evaluated prior to inductionOxygen Delivery Method: Circle system utilized Preoxygenation: Pre-oxygenation with 100% oxygen Intubation Type: IV induction Ventilation: Oral airway inserted - appropriate to patient size and Mask ventilation without difficulty Laryngoscope Size: Mac Tube type: Oral Tube size: 7.0 mm Number of attempts: 1 Airway Equipment and Method: Video-laryngoscopy and Stylet Placement Confirmation: ETT inserted through vocal cords under direct vision,  breath sounds checked- equal and bilateral and positive ETCO2 Secured at: 22 cm Tube secured with: Tape Dental Injury: Teeth and Oropharynx as per pre-operative assessment

## 2012-04-05 NOTE — Op Note (Signed)
NAMEHARLYNN, Amber Cook NO.:  [id number]  MEDICAL RECORD NO.:  [id number]  LOCATION:  6N32C                        FACILITY:  [facility]  PHYSICIAN:  Thornton Park. Daphine Deutscher, MD  DATE OF BIRTH:  July 14, 1945  DATE OF PROCEDURE: DATE OF DISCHARGE:                              OPERATIVE REPORT   PREOPERATIVE DIAGNOSIS:  Primary hyperparathyroidism with ectopic parathyroid on a sestamibi scan.  POSTOPERATIVE DIAGNOSIS:  Right superior parathyroid adenoma, 800 mg by frozen section.  SURGEON:  Thornton Park. Daphine Deutscher, MD  ASSISTANT:  Velora Heckler, MD.  ANESTHESIA:  General endotracheal.  DESCRIPTION OF PROCEDURE:  The patient was taken to room #10 and given general anesthesia.  The abdomen was prepped with chlorhexidine and draped sterilely.  Time-out was performed.  Transverse incision was made at about 2 cm above the sternal notch.  The patient had a previous ASD repair and a median sternotomy incision in place.  Platysma was divided and then supra and infra flaps were created below the platysma.  The midline was identified and opened in the straps were spread.  The patient had a fairly low-lying thyroid gland and by the scan the ectopic parathyroid tissue was supposed to be in the midline.  We did a thorough exploration of this area and found no parathyroid tissue.  We then proceeded to explore the left side 1st and found a left inferior that looked little large, although it did have fat in it.  This was sent for frozen and was found to be with consistent with normal parathyroid gland.  We explored the right side and found a posterior right gland that we dissected free and improved and do not have as much fat and was about a cm in size and was more consistent with parathyroid adenoma. This was removed again using a little clips to control the vascular supply.  In addition to the findings, there was left inferior thyroid lobe cyst, which we will get an ultrasound in 3 months.   It did not look malignant, but we felt we would follow up with thyroid ultrasound. Bleeding was controlled.  Fibrillar was laid on all the raw surfaces. We stayed away from the recurrent laryngeal nerves.  The strap muscles were approximated in the midline with 3-0 Vicryl and the platysma was reapproximated with 4-0 Vicryl subcutaneously and subcuticularly with benzoin Steri-Strips on the skin.  The patient tolerated the procedure well and was taken to recovery room in satisfactory condition.    Thornton Park Daphine Deutscher, MD    MBM/MEDQ  D:  04/05/2012  T:  04/05/2012  Job:  [id number]  cc:   Claudette Stapler

## 2012-04-05 NOTE — Plan of Care (Signed)
Problem: Consults Goal: Diabetes Guidelines if Diabetic/Glucose > 140 If diabetic or lab glucose is > 140 mg/dl - Initiate Diabetes/Hyperglycemia Guidelines & Document Interventions  Outcome: Not Applicable Date Met:  04/05/12 Pt denies history of DM

## 2012-04-05 NOTE — Anesthesia Preprocedure Evaluation (Addendum)
Anesthesia Evaluation  Patient identified by MRN, date of birth, ID band Patient awake    Reviewed: Allergy & Precautions, H&P , NPO status , Patient's Chart, lab work & pertinent test results  Airway Mallampati: II TM Distance: <3 FB Neck ROM: full    Dental  (+) Teeth Intact and Dental Advisory Given   Pulmonary sleep apnea ,          Cardiovascular hypertension, + Peripheral Vascular Disease and +CHF Rhythm:Regular Rate:Normal     Neuro/Psych Depression  Neuromuscular disease    GI/Hepatic GERD-  ,  Endo/Other  diabetes, Type 2Hypothyroidism Morbid obesity  Renal/GU      Musculoskeletal   Abdominal   Peds  Hematology   Anesthesia Other Findings   Reproductive/Obstetrics                          Anesthesia Physical Anesthesia Plan  ASA: III  Anesthesia Plan: General   Post-op Pain Management:    Induction: Intravenous  Airway Management Planned: Oral ETT  Additional Equipment:   Intra-op Plan:   Post-operative Plan: Extubation in OR  Informed Consent: I have reviewed the patients History and Physical, chart, labs and discussed the procedure including the risks, benefits and alternatives for the proposed anesthesia with the patient or authorized representative who has indicated his/her understanding and acceptance.     Plan Discussed with: CRNA and Surgeon  Anesthesia Plan Comments:         Anesthesia Quick Evaluation

## 2012-04-05 NOTE — Brief Op Note (Signed)
04/05/2012  10:01 AM  PATIENT:  Amber Cook  67 y.o. female  PRE-OPERATIVE DIAGNOSIS:  parathyroid adenoma   POST-OPERATIVE DIAGNOSIS:  parathyroid adenoma   PROCEDURE:  Procedure(s) (LRB) with comments: PARATHYROID EXPLORATION (N/A) - Exploration of parathryroid and removal of left and right interior parathyroid  SURGEON:  Surgeon(s) and Role:    * Valarie Merino, MD - Primary    * Velora Heckler, MD - Assisting  PHYSICIAN ASSISTANT:   ASSISTANTS: Darnell Level, MD, FACS   ANESTHESIA:   general  EBL:     BLOOD ADMINISTERED:none  DRAINS: none   LOCAL MEDICATIONS USED:  NONE  SPECIMEN:  Source of Specimen:  parathyroid glands  DISPOSITION OF SPECIMEN:  PATHOLOGY  COUNTS:  YES  TOURNIQUET:  * No tourniquets in log *  DICTATION: .Other Dictation: Dictation Number [id number]  PLAN OF CARE: Admit for overnight observation  PATIENT DISPOSITION:  PACU - hemodynamically stable.   Delay start of Pharmacological VTE agent (>24hrs) due to surgical blood loss or risk of bleeding: no

## 2012-04-05 NOTE — Transfer of Care (Signed)
Immediate Anesthesia Transfer of Care Note  Patient: Amber Cook  Procedure(s) Performed: Procedure(s) (LRB) with comments: PARATHYROID EXPLORATION (N/A) - Exploration of parathryroid and removal of left and right interior parathyroid  Patient Location: PACU  Anesthesia Type:General  Level of Consciousness: lethargic and responds to stimulation  Airway & Oxygen Therapy: Patient Spontanous Breathing and Patient connected to face mask oxygen  Post-op Assessment: Report given to PACU RN  Post vital signs: Reviewed and stable  Complications: No apparent anesthesia complications

## 2012-04-05 NOTE — Preoperative (Signed)
Beta Blockers   Reason not to administer Beta Blockers:Not Applicable, last dose 04/04/12 at 20:00

## 2012-04-06 ENCOUNTER — Ambulatory Visit (HOSPITAL_COMMUNITY): Payer: Medicare Other

## 2012-04-06 LAB — CBC
HCT: 36.8 % (ref 36.0–46.0)
MCH: 29 pg (ref 26.0–34.0)
MCHC: 30.2 g/dL (ref 30.0–36.0)
MCV: 96.1 fL (ref 78.0–100.0)
Platelets: 230 10*3/uL (ref 150–400)
RDW: 13 % (ref 11.5–15.5)

## 2012-04-06 LAB — COMPREHENSIVE METABOLIC PANEL
Albumin: 3.2 g/dL — ABNORMAL LOW (ref 3.5–5.2)
BUN: 17 mg/dL (ref 6–23)
Calcium: 8.7 mg/dL (ref 8.4–10.5)
Creatinine, Ser: 0.8 mg/dL (ref 0.50–1.10)
Total Bilirubin: 0.4 mg/dL (ref 0.3–1.2)
Total Protein: 6.9 g/dL (ref 6.0–8.3)

## 2012-04-06 MED ORDER — FUROSEMIDE 10 MG/ML IJ SOLN
40.0000 mg | Freq: Once | INTRAMUSCULAR | Status: AC
  Administered 2012-04-06: 40 mg via INTRAVENOUS
  Filled 2012-04-06: qty 4

## 2012-04-06 MED ORDER — WHITE PETROLATUM GEL
Status: AC
  Filled 2012-04-06: qty 5

## 2012-04-06 NOTE — Progress Notes (Signed)
Patient ID: Amber Cook, female   DOB: 05-14-1945, 67 y.o.   MRN: [id number] [facility] Progress Note:   1 Day Post-Op  Subjective: Mental status is clear.  Sitting up because of breathing issues.  Needs CPAP Objective: Vital signs in last 24 hours: Temp:  [97.4 F (36.3 C)-98.9 F (37.2 C)] 97.8 F (36.6 C) (01/15 0535) Pulse Rate:  [84-97] 93  (01/15 0535) Resp:  [16-22] 18  (01/15 0535) BP: (118-176)/(58-98) 118/58 mmHg (01/15 0535) SpO2:  [89 %-96 %] 95 % (01/15 0535) Weight:  [290 lb 2 oz (131.6 kg)] 290 lb 2 oz (131.6 kg) (01/14 1400)  Intake/Output from previous day: 01/14 0701 - 01/15 0700 In: 1880 [P.O.:480; I.V.:1400] Out: 500 [Urine:500] Intake/Output this shift:    Physical Exam: Work of breathing is not labored but she is speaking but coarse.  Wound puffy but not tense or bruised.    Lab Results:  Results for orders placed during the hospital encounter of 04/05/12 (from the past 48 hour(s))  GLUCOSE, CAPILLARY     Status: Abnormal   Collection Time   04/05/12 10:11 AM      Component Value Range Comment   Glucose-Capillary 120 (*) 70 - 99 mg/dL   GLUCOSE, CAPILLARY     Status: Abnormal   Collection Time   04/05/12 12:05 PM      Component Value Range Comment   Glucose-Capillary 126 (*) 70 - 99 mg/dL   CBC     Status: Normal   Collection Time   04/05/12 12:40 PM      Component Value Range Comment   WBC 8.5  4.0 - 10.5 K/uL    RBC 4.33  3.87 - 5.11 MIL/uL    Hemoglobin 13.7  12.0 - 15.0 g/dL    HCT 40.9  81.1 - 91.4 %    MCV 94.9  78.0 - 100.0 fL    MCH 31.6  26.0 - 34.0 pg    MCHC 33.3  30.0 - 36.0 g/dL    RDW 78.2  95.6 - 21.3 %    Platelets 204  150 - 400 K/uL   CREATININE, SERUM     Status: Abnormal   Collection Time   04/05/12 12:40 PM      Component Value Range Comment   Creatinine, Ser 0.87  0.50 - 1.10 mg/dL    GFR calc non Af Amer 67 (*) >90 mL/min    GFR calc Af Amer 78 (*) >90 mL/min   CALCIUM, IONIZED     Status: Normal   Collection Time   04/05/12  6:00 PM      Component Value Range Comment   Calcium, Ion 1.30  1.12 - 1.32 mmol/L   CBC     Status: Abnormal   Collection Time   04/06/12  4:42 AM      Component Value Range Comment   WBC 7.8  4.0 - 10.5 K/uL    RBC 3.83 (*) 3.87 - 5.11 MIL/uL    Hemoglobin 11.1 (*) 12.0 - 15.0 g/dL DELTA CHECK NOTED   HCT 36.8  36.0 - 46.0 %    MCV 96.1  78.0 - 100.0 fL    MCH 29.0  26.0 - 34.0 pg    MCHC 30.2  30.0 - 36.0 g/dL    RDW 08.6  57.8 - 46.9 %    Platelets 230  150 - 400 K/uL   COMPREHENSIVE METABOLIC PANEL     Status: Abnormal   Collection Time  04/06/12  4:42 AM      Component Value Range Comment   Sodium 136  135 - 145 mEq/L    Potassium 4.0  3.5 - 5.1 mEq/L    Chloride 100  96 - 112 mEq/L    CO2 25  19 - 32 mEq/L    Glucose, Bld 129 (*) 70 - 99 mg/dL    BUN 17  6 - 23 mg/dL    Creatinine, Ser 1.61  0.50 - 1.10 mg/dL    Calcium 8.7  8.4 - 09.6 mg/dL    Total Protein 6.9  6.0 - 8.3 g/dL    Albumin 3.2 (*) 3.5 - 5.2 g/dL    AST 20  0 - 37 U/L    ALT 19  0 - 35 U/L    Alkaline Phosphatase 112  39 - 117 U/L    Total Bilirubin 0.4  0.3 - 1.2 mg/dL    GFR calc non Af Amer 75 (*) >90 mL/min    GFR calc Af Amer 86 (*) >90 mL/min     Radiology/Results: No results found.  Anti-infectives: Anti-infectives     Start     Dose/Rate Route Frequency Ordered Stop   04/05/12 0600   ceFAZolin (ANCEF) 3 g in dextrose 5 % 50 mL IVPB        3 g 160 mL/hr over 30 Minutes Intravenous On call to O.R. 04/04/12 1428 04/05/12 0750          Assessment/Plan: Problem List: Patient Active Problem List  Diagnosis  . Hypercalcemia  . Status post patch closure of ASD  . Parathyroid adenoma    Not ready for Discharge today.  Will get BPAP and check CMET tomorrow.  1 Day Post-Op    LOS: 1 day   Matt B. Daphine Deutscher, MD, [facility] [phone] beeper [phone]  04/06/2012 7:45 AM  Breathing is much better with BPAP.  Xray reviewed.   Her sore throat is likely related to the posterior dissection.  Her airway is not compromised but she mainly hurts when she swallows.  Retained fluid in legs--will give another dose of lasix.

## 2012-04-06 NOTE — Progress Notes (Signed)
Pt c/o swelling to the rt side of her neck, which has a little bit bruising around the area. Pt is able to swallow. No difficulty breathing while in the chair. Will cont to monitor.

## 2012-04-06 NOTE — Progress Notes (Signed)
Notified Dr. Daphine Deutscher of neck xray report

## 2012-04-06 NOTE — Progress Notes (Signed)
Placed pt on full face mask CPAP pressure at 8. Pt tolerating well at this time. RT will continue to monitor.

## 2012-04-07 ENCOUNTER — Encounter (HOSPITAL_COMMUNITY): Payer: Self-pay | Admitting: Surgery

## 2012-04-07 LAB — CBC
HCT: 39.3 % (ref 36.0–46.0)
Hemoglobin: 12.9 g/dL (ref 12.0–15.0)
MCV: 96.6 fL (ref 78.0–100.0)
Platelets: 204 10*3/uL (ref 150–400)
RBC: 4.07 MIL/uL (ref 3.87–5.11)
WBC: 6 10*3/uL (ref 4.0–10.5)

## 2012-04-07 LAB — COMPREHENSIVE METABOLIC PANEL
AST: 19 U/L (ref 0–37)
CO2: 30 mEq/L (ref 19–32)
Chloride: 102 mEq/L (ref 96–112)
Creatinine, Ser: 0.86 mg/dL (ref 0.50–1.10)
GFR calc non Af Amer: 68 mL/min — ABNORMAL LOW (ref >90)
Glucose, Bld: 114 mg/dL — ABNORMAL HIGH (ref 70–99)
Total Bilirubin: 0.5 mg/dL (ref 0.3–1.2)

## 2012-04-07 MED ORDER — TRAMADOL HCL 50 MG PO TABS: 50.0000 mg | ORAL_TABLET | Freq: Four times a day (QID) | ORAL | Status: DC

## 2012-04-07 NOTE — Progress Notes (Signed)
Patient ID: Amber Cook, female   DOB: 1945-07-12, 67 y.o.   MRN: [id number] 2 Days Post-Op  Subjective: Pt feels ok today.  No difficulty breathing.  Objective: Vital signs in last 24 hours: Temp:  [97.3 F (36.3 C)-98.6 F (37 C)] 97.3 F (36.3 C) (01/16 0602) Pulse Rate:  [77-94] 77  (01/16 0602) Resp:  [18] 18  (01/16 0602) BP: (112-140)/(51-70) 140/68 mmHg (01/16 0602) SpO2:  [90 %-96 %] 96 % (01/16 0602) Last BM Date: 04/05/12  Intake/Output from previous day: 01/15 0701 - 01/16 0700 In: 1495 [P.O.:240; I.V.:1255] Out: 5300 [Urine:5300] Intake/Output this shift:    PE: Neck: some hematoma noted, but actually seems a bit smaller than yesterday afternoon, incision c/d/i  Lab Results:   Basename 04/06/12 0442 04/05/12 1240  WBC 7.8 8.5  HGB 11.1* 13.7  HCT 36.8 41.1  PLT 230 204   BMET  Basename 04/06/12 0442 04/05/12 1240  NA 136 --  K 4.0 --  CL 100 --  CO2 25 --  GLUCOSE 129* --  BUN 17 --  CREATININE 0.80 0.87  CALCIUM 8.7 --   PT/INR No results found for this basename: LABPROT:2,INR:2 in the last 72 hours CMP     Component Value Date/Time   NA 136 04/06/2012 0442   K 4.0 04/06/2012 0442   CL 100 04/06/2012 0442   CO2 25 04/06/2012 0442   GLUCOSE 129* 04/06/2012 0442   BUN 17 04/06/2012 0442   CREATININE 0.80 04/06/2012 0442   CALCIUM 8.7 04/06/2012 0442   CALCIUM 10.5 01/06/2012 1630   PROT 6.9 04/06/2012 0442   ALBUMIN 3.2* 04/06/2012 0442   AST 20 04/06/2012 0442   ALT 19 04/06/2012 0442   ALKPHOS 112 04/06/2012 0442   BILITOT 0.4 04/06/2012 0442   GFRNONAA 75* 04/06/2012 0442   GFRAA 86* 04/06/2012 0442   Lipase  No results found for this basename: lipase       Studies/Results: Dg Neck Soft Tissue  04/06/2012  *RADIOLOGY REPORT*  Clinical Data: Swelling of the right side of the neck.  Bruising. Recent parathyroid adenoma resection.  NECK SOFT TISSUES - 1+ VIEW  Comparison: None.  Findings: There is prevertebral soft tissue swelling  consistent with edema or hemorrhage.  There is some air and edema in the subcutaneous fat below the mandible.  There is no compression of the upper airway.  Multiple surgical clips are seen at the site of the previous parathyroid adenoma resection.  IMPRESSION: There is abnormal prevertebral soft tissue swelling consistent with either edema or hemorrhage in the prevertebral soft tissues. Trachea is deviated slightly anteriorly.   Original Report Authenticated By: Francene Boyers, M.D.     Anti-infectives: Anti-infectives     Start     Dose/Rate Route Frequency Ordered Stop   04/05/12 0600   ceFAZolin (ANCEF) 3 g in dextrose 5 % 50 mL IVPB        3 g 160 mL/hr over 30 Minutes Intravenous On call to O.R. 04/04/12 1428 04/05/12 0750           Assessment/Plan  1. S/p parathyroidectomy 2. Hematoma  Plan: 1. Advance diet, check calcium.  If all is ok then plan for dc home later today.   LOS: 2 days    Jomaira Darr E 04/07/2012, 7:46 AM Pager: [phone]

## 2012-04-07 NOTE — Care Management Note (Signed)
  Page 1 of 1   04/07/2012     1:32:39 PM   CARE MANAGEMENT NOTE 04/07/2012  Patient:  TAUNIA, FRASCO   Account Number:  [id number]  Date Initiated:  04/07/2012  Documentation initiated by:  Ronny Flurry  Subjective/Objective Assessment:     Action/Plan:   Anticipated DC Date:  04/07/2012   Anticipated DC Plan:  HOME/SELF CARE         Choice offered to / List presented to:             Status of service:   Medicare Important Message given?   (If response is "NO", the following Medicare IM given date fields will be blank) Date Medicare IM given:   Date Additional Medicare IM given:    Discharge Disposition:    Per UR Regulation:    If discussed at Long Length of Stay Meetings, dates discussed:    Comments:  04-07-12 Patient requesting new CPAP for home . Has current one through [facility] , phone [phone] , fax [phone] . Spoke with Beth at [facility] it has been over 5 years since patient has received CPAP , she is eligible for new machine. FAXed order and note from PA to [facility] , they will call patient directly for delivery once insurance has approved. Ronny Flurry RN BSn [phone]

## 2012-04-07 NOTE — Progress Notes (Signed)
Patient discharged to home in care of sister. Medications and instructions reviewed with patient and sister with all questions answered. IV d/c'd with cath intact and dressing CDI. CPAP worked out for home delivery. Assessment unchanged from this am. Patient is to follow up with Dr. Daphine Deutscher in 2 weeks.

## 2012-04-07 NOTE — Progress Notes (Signed)
Patient ID: Amber Cook, female   DOB: 1945/10/29, 67 y.o.   MRN: [id number] The patient had a recent sleep study by Dr. Tresa Endo which revealed she still needed a CPAP machine at a pressure of 9.  Amber Cook E

## 2012-04-11 NOTE — Discharge Summary (Signed)
Physician Discharge Summary  Patient ID: Amber Cook MRN: [id number] DOB/AGE: Aug 29, 1945 67 y.o.  Admit date: 04/05/2012 Discharge date: 04/07/2012  Admission Diagnoses:  Occult parathyroid adenoma unable to localize on nuclear medicine scan  Discharge Diagnoses:  Right inferior parathyroid adenoma  Active Problems:  * No active hospital problems. *    Surgery:  Total neck exploration for adenoma  Discharged Condition: improved  Hospital Course: Had surgery.  Postop swelling aggravated OSA.  Required CPAP and longer observation.  Seen by DOW in my absence due to illness on her discharge day.   Consults: none  Significant Diagnostic Studies: neck xray    Discharge Exam: Blood pressure 140/68, pulse 77, temperature 97.3 F (36.3 C), temperature source Oral, resp. rate 18, height 5\' 6"  (1.676 m), weight 290 lb 2 oz (131.6 kg), SpO2 96.00%. Was not performed by me.    Disposition: 01-Home or Self Care     Medication List     As of 04/11/2012  8:40 AM    STOP taking these medications         aspirin 325 MG buffered tablet      TAKE these medications         CENTRUM SILVER PO   Take by mouth.      colesevelam 625 MG tablet   Commonly known as: WELCHOL   Take 1,875 mg by mouth 2 (two) times daily with a meal.      escitalopram 10 MG tablet   Commonly known as: LEXAPRO   Take 10 mg by mouth daily.      furosemide 40 MG tablet   Commonly known as: LASIX   Take 40-80 mg by mouth daily. Takes 40 mg daily, takes an extra tablet if swelling is increased.      KLOR-CON M10 10 MEQ tablet   Generic drug: potassium chloride   Take 10-20 mEq by mouth daily. Takes 10 meq daily unless she takes extra furosemide then she takes 20 meq.      levothyroxine 125 MCG tablet   Commonly known as: SYNTHROID, LEVOTHROID   Take 125 mcg by mouth daily.      meloxicam 15 MG tablet   Commonly known as: MOBIC   Take 15 mg by mouth daily as needed. Muscle pain.      metoprolol  succinate 25 MG 24 hr tablet   Commonly known as: TOPROL-XL   Take 25 mg by mouth daily.      traMADol 50 MG tablet   Commonly known as: ULTRAM   Take 50 mg by mouth every 6 (six) hours as needed. pain      traMADol 50 MG tablet   Commonly known as: ULTRAM   Take 1 tablet (50 mg total) by mouth every 6 (six) hours.           Follow-up Information    Follow up with Luretha Murphy B, MD. Schedule an appointment as soon as possible for a visit in 2 weeks.   Contact information:   [address] [place] [address] [phone]          Signed: Valarie Merino 04/11/2012, 8:40 AM

## 2012-04-13 ENCOUNTER — Telehealth (INDEPENDENT_AMBULATORY_CARE_PROVIDER_SITE_OTHER): Payer: Self-pay

## 2012-04-13 NOTE — Telephone Encounter (Signed)
LMOM for pt letting her know of her 1st po appt date and time: Thurs 1/30 @12p 

## 2012-04-21 ENCOUNTER — Ambulatory Visit (INDEPENDENT_AMBULATORY_CARE_PROVIDER_SITE_OTHER): Payer: Medicare Other | Admitting: Surgery

## 2012-04-21 ENCOUNTER — Encounter (INDEPENDENT_AMBULATORY_CARE_PROVIDER_SITE_OTHER): Payer: Self-pay | Admitting: Surgery

## 2012-04-21 VITALS — BP 110/70 | HR 76 | Temp 98.8°F | Resp 20 | Ht 66.0 in | Wt 282.0 lb

## 2012-04-21 DIAGNOSIS — Z862 Personal history of diseases of the blood and blood-forming organs and certain disorders involving the immune mechanism: Secondary | ICD-10-CM

## 2012-04-21 DIAGNOSIS — Z8639 Personal history of other endocrine, nutritional and metabolic disease: Secondary | ICD-10-CM

## 2012-04-21 LAB — COMPREHENSIVE METABOLIC PANEL
AST: 20 U/L (ref 0–37)
Alkaline Phosphatase: 102 U/L (ref 39–117)
BUN: 18 mg/dL (ref 6–23)
Creat: 1.14 mg/dL — ABNORMAL HIGH (ref 0.50–1.10)
Potassium: 4.3 mEq/L (ref 3.5–5.3)

## 2012-04-21 NOTE — Progress Notes (Signed)
Amber Cook 67 y.o.  Body mass index is 45.52 kg/(m^2).  Patient Active Problem List  Diagnosis  . Hypercalcemia  . Status post patch closure of ASD  . Parathyroid adenoma    Allergies  Allergen Reactions  . Erythromycin Nausea And Vomiting    Stomach pain  . Lidocaine   . Other     pomagranite--tongue itching  Nuts-tongue with a funny feeling  . Prilosec (Omeprazole)   . Statins Other (See Comments)    Muscle cramps.   . Vicodin (Hydrocodone-Acetaminophen)     Past Surgical History  Procedure Date  . Tonsillectomy and adenoidectomy     and adenoids at age 68  . Atral septal defect repair   . Right vein ligation 2009  . Cervical biopsy   . Left breast biopsy   . Colonoscopy   . Cardiac catheterization 2008  . 2d echo with bubble study 03/31/12  . Parathyroid exploration 04/05/2012    Procedure: PARATHYROID EXPLORATION;  Surgeon: Valarie Merino, MD;  Location: [facility] OR;  Service: General;  Laterality: N/A;  Exploration of parathryroid and removal of left and right interior parathyroid   Michiel Sites, MD No diagnosis found.  Doing well after parathyroid adenoma excision.  Incision OK.  Will check CMET and see back in 2 months Matt B. Daphine Deutscher, MD, [facility] [phone] beeper [phone]  04/21/2012 12:34 PM

## 2012-07-13 ENCOUNTER — Encounter (INDEPENDENT_AMBULATORY_CARE_PROVIDER_SITE_OTHER): Payer: Self-pay

## 2012-08-05 ENCOUNTER — Encounter: Payer: Self-pay | Admitting: Cardiovascular Disease

## 2012-08-05 ENCOUNTER — Encounter: Payer: Self-pay | Admitting: Endocrinology

## 2012-08-24 ENCOUNTER — Encounter (INDEPENDENT_AMBULATORY_CARE_PROVIDER_SITE_OTHER): Payer: Self-pay | Admitting: Surgery

## 2012-08-24 ENCOUNTER — Ambulatory Visit (INDEPENDENT_AMBULATORY_CARE_PROVIDER_SITE_OTHER): Payer: Medicare Other | Admitting: Surgery

## 2012-08-24 VITALS — BP 124/84 | HR 65 | Temp 97.8°F | Resp 20 | Ht 66.03 in | Wt 278.2 lb

## 2012-08-24 DIAGNOSIS — D351 Benign neoplasm of parathyroid gland: Secondary | ICD-10-CM

## 2012-08-24 NOTE — Patient Instructions (Signed)
Thanks for your patience.  If you need further assistance after leaving the office, please call our office and speak with a [facility] nurse.  [phone].  If you want to leave a message for Dr. Ornella Coderre, please call his office phone at [phone]. 

## 2012-08-24 NOTE — Progress Notes (Signed)
Amber Cook 67 y.o.  Body mass index is 44.87 kg/(m^2).  Patient Active Problem List   Diagnosis Date Noted  . Parathyroid adenoma 01/20/2012  . Hypercalcemia 01/06/2012  . Status post patch closure of ASD 01/06/2012    Allergies  Allergen Reactions  . Erythromycin Nausea And Vomiting    Stomach pain  . Lidocaine   . Other     pomagranite--tongue itching  Nuts-tongue with a funny feeling  . Prilosec (Omeprazole)   . Statins Other (See Comments)    Muscle cramps.   . Vicodin (Hydrocodone-Acetaminophen)     Past Surgical History  Procedure Laterality Date  . Tonsillectomy and adenoidectomy      and adenoids at age 25  . Atral septal defect repair    . Right vein ligation  2009  . Cervical biopsy    . Left breast biopsy    . Colonoscopy    . Cardiac catheterization  2008  . 2d echo with bubble study  03/31/12  . Parathyroid exploration  04/05/2012    Procedure: PARATHYROID EXPLORATION;  Surgeon: Valarie Merino, MD;  Location: [facility] OR;  Service: General;  Laterality: N/A;  Exploration of parathryroid and removal of left and right interior parathyroid   Michiel Sites, MD No diagnosis found.  Incision has a keloid but is getting softer.  Ca down.  Doing very well. Return prn Matt B. Daphine Deutscher, MD, [facility] [phone] beeper [phone]  08/24/2012 3:44 PM

## 2012-11-15 ENCOUNTER — Encounter: Payer: Self-pay | Admitting: Cardiology

## 2012-11-15 DIAGNOSIS — E119 Type 2 diabetes mellitus without complications: Secondary | ICD-10-CM | POA: Insufficient documentation

## 2012-11-15 DIAGNOSIS — E785 Hyperlipidemia, unspecified: Secondary | ICD-10-CM | POA: Insufficient documentation

## 2012-11-15 DIAGNOSIS — G4730 Sleep apnea, unspecified: Secondary | ICD-10-CM | POA: Insufficient documentation

## 2012-11-15 DIAGNOSIS — M1990 Unspecified osteoarthritis, unspecified site: Secondary | ICD-10-CM | POA: Insufficient documentation

## 2012-11-15 DIAGNOSIS — K219 Gastro-esophageal reflux disease without esophagitis: Secondary | ICD-10-CM

## 2012-11-15 DIAGNOSIS — I5189 Other ill-defined heart diseases: Secondary | ICD-10-CM

## 2012-11-15 DIAGNOSIS — E349 Endocrine disorder, unspecified: Secondary | ICD-10-CM

## 2012-11-15 DIAGNOSIS — E039 Hypothyroidism, unspecified: Secondary | ICD-10-CM | POA: Insufficient documentation

## 2012-11-15 DIAGNOSIS — I10 Essential (primary) hypertension: Secondary | ICD-10-CM | POA: Insufficient documentation

## 2012-11-15 DIAGNOSIS — G629 Polyneuropathy, unspecified: Secondary | ICD-10-CM

## 2012-11-15 DIAGNOSIS — I872 Venous insufficiency (chronic) (peripheral): Secondary | ICD-10-CM | POA: Insufficient documentation

## 2012-11-15 DIAGNOSIS — Q211 Atrial septal defect: Secondary | ICD-10-CM | POA: Insufficient documentation

## 2012-11-18 ENCOUNTER — Ambulatory Visit (INDEPENDENT_AMBULATORY_CARE_PROVIDER_SITE_OTHER): Payer: Medicare Other | Admitting: Cardiovascular Disease

## 2012-11-18 ENCOUNTER — Ambulatory Visit: Payer: Medicare Other | Admitting: Cardiovascular Disease

## 2012-11-18 ENCOUNTER — Encounter: Payer: Self-pay | Admitting: Cardiovascular Disease

## 2012-11-18 VITALS — BP 160/90 | HR 74 | Ht 66.34 in | Wt 293.3 lb

## 2012-11-18 DIAGNOSIS — E785 Hyperlipidemia, unspecified: Secondary | ICD-10-CM

## 2012-11-18 DIAGNOSIS — I10 Essential (primary) hypertension: Secondary | ICD-10-CM

## 2012-11-18 DIAGNOSIS — Q211 Atrial septal defect: Secondary | ICD-10-CM

## 2012-11-18 DIAGNOSIS — Q2111 Secundum atrial septal defect: Secondary | ICD-10-CM

## 2012-11-18 DIAGNOSIS — G4733 Obstructive sleep apnea (adult) (pediatric): Secondary | ICD-10-CM

## 2012-11-18 NOTE — Patient Instructions (Addendum)
Your physician recommends that you schedule a follow-up appointment as needed with Dr. Kelly for your sleep apnea. 

## 2012-11-19 ENCOUNTER — Encounter: Payer: Self-pay | Admitting: Cardiovascular Disease

## 2012-11-19 ENCOUNTER — Emergency Department (HOSPITAL_BASED_OUTPATIENT_CLINIC_OR_DEPARTMENT_OTHER)
Admission: EM | Admit: 2012-11-19 | Discharge: 2012-11-20 | Disposition: A | Discharge status: Home / Self Care | Payer: Medicare Other | Attending: Emergency Medicine | Admitting: Emergency Medicine

## 2012-11-19 ENCOUNTER — Encounter (HOSPITAL_BASED_OUTPATIENT_CLINIC_OR_DEPARTMENT_OTHER): Payer: Self-pay | Admitting: *Deleted

## 2012-11-19 DIAGNOSIS — Z8669 Personal history of other diseases of the nervous system and sense organs: Secondary | ICD-10-CM | POA: Insufficient documentation

## 2012-11-19 DIAGNOSIS — E119 Type 2 diabetes mellitus without complications: Secondary | ICD-10-CM | POA: Insufficient documentation

## 2012-11-19 DIAGNOSIS — M129 Arthropathy, unspecified: Secondary | ICD-10-CM | POA: Insufficient documentation

## 2012-11-19 DIAGNOSIS — Z8701 Personal history of pneumonia (recurrent): Secondary | ICD-10-CM | POA: Insufficient documentation

## 2012-11-19 DIAGNOSIS — Z79899 Other long term (current) drug therapy: Secondary | ICD-10-CM | POA: Insufficient documentation

## 2012-11-19 DIAGNOSIS — B372 Candidiasis of skin and nail: Secondary | ICD-10-CM | POA: Insufficient documentation

## 2012-11-19 DIAGNOSIS — F3289 Other specified depressive episodes: Secondary | ICD-10-CM | POA: Insufficient documentation

## 2012-11-19 DIAGNOSIS — E785 Hyperlipidemia, unspecified: Secondary | ICD-10-CM | POA: Insufficient documentation

## 2012-11-19 DIAGNOSIS — E039 Hypothyroidism, unspecified: Secondary | ICD-10-CM | POA: Insufficient documentation

## 2012-11-19 DIAGNOSIS — Z8619 Personal history of other infectious and parasitic diseases: Secondary | ICD-10-CM | POA: Insufficient documentation

## 2012-11-19 DIAGNOSIS — I10 Essential (primary) hypertension: Secondary | ICD-10-CM | POA: Insufficient documentation

## 2012-11-19 DIAGNOSIS — I5030 Unspecified diastolic (congestive) heart failure: Secondary | ICD-10-CM | POA: Insufficient documentation

## 2012-11-19 DIAGNOSIS — Z8719 Personal history of other diseases of the digestive system: Secondary | ICD-10-CM | POA: Insufficient documentation

## 2012-11-19 DIAGNOSIS — F329 Major depressive disorder, single episode, unspecified: Secondary | ICD-10-CM | POA: Insufficient documentation

## 2012-11-19 DIAGNOSIS — Z7982 Long term (current) use of aspirin: Secondary | ICD-10-CM | POA: Insufficient documentation

## 2012-11-19 DIAGNOSIS — Z8639 Personal history of other endocrine, nutritional and metabolic disease: Secondary | ICD-10-CM | POA: Insufficient documentation

## 2012-11-19 DIAGNOSIS — Z8709 Personal history of other diseases of the respiratory system: Secondary | ICD-10-CM | POA: Insufficient documentation

## 2012-11-19 DIAGNOSIS — Z862 Personal history of diseases of the blood and blood-forming organs and certain disorders involving the immune mechanism: Secondary | ICD-10-CM | POA: Insufficient documentation

## 2012-11-19 DIAGNOSIS — Z8679 Personal history of other diseases of the circulatory system: Secondary | ICD-10-CM | POA: Insufficient documentation

## 2012-11-19 DIAGNOSIS — G4733 Obstructive sleep apnea (adult) (pediatric): Secondary | ICD-10-CM | POA: Insufficient documentation

## 2012-11-19 NOTE — ED Notes (Signed)
Pt reports she worked in the yard all day, took a shower then noticed rash under left breast-

## 2012-11-19 NOTE — ED Provider Notes (Signed)
CSN: [id number]     Arrival date & time 11/19/12  2342 History  This chart was scribed for Amber Bua Smitty Cords, MD by Karle Plumber, ED Scribe. This patient was seen in room MH06/MH06 and the patient's care was started at 12:00 AM.   Chief Complaint  Patient presents with  . Rash   Patient is a 67 y.o. female presenting with rash. The history is provided by the patient. No language interpreter was used.  Rash Location:  Torso Torso rash location: underneath left breast. Quality: itchiness   Quality: not blistering and not painful   Severity:  Mild Onset quality:  Sudden Duration: several hours. Timing:  Constant Progression:  Unchanged Chronicity:  New Context: not sick contacts   Context comment:  Unknown Relieved by:  Nothing Worsened by:  Nothing tried Ineffective treatments:  None tried Associated symptoms: no abdominal pain, no fever, no headaches, no shortness of breath, no sore throat, no throat swelling, no tongue swelling and not vomiting    HPI Comments:  Amber Cook is a 67 y.o. female who presents to the Emergency Department complaining of a mild rash to the skin fold beneath the left breast onset several hours ago. Pt states she was out working in her yard all day and noticed the rash when she came in to take a shower. She denies abdominal pain, shortness of breath, fever, sore throat, congestion, difficulty urinating, back pain, headaches, chest pain, or any other symptoms at this time. Patient has a medical history of hyperlipidemia, DM, GERD and hypothyroidism.    Past Medical History  Diagnosis Date  . Diastolic dysfunction 03/31/12    grade 1  . Hyperlipidemia     takes Welchol bid   . Hypertension     takes Metoprolol nightly   . Pneumonia     hx of->a yr ago  . History of bronchitis     a month ago  . Sleep apnea     supposed to use CPAP;repeat study tonight @ [address]  . Peripheral neuropathy   . Hypercalcemia   . Elevated parathyroid  hormone   . Arthritis   . Vaginal yeast infection   . Diabetes mellitus without complication     borderline  . GERD (gastroesophageal reflux disease)     but doesn't require meds  . Colitis   . Internal hemorrhoids   . Urinary frequency     takes Lasix daily  . Hypothyroidism     takes Synthroid daily  . Depression     takes Lexapro daily  . ASD (atrial septal defect), ostium secundum     surgical repair 7/08  . Venous insufficiency     SVG ablation   Past Surgical History  Procedure Laterality Date  . Tonsillectomy and adenoidectomy      and adenoids at age 47  . Atral septal defect repair  July 2009    after failed ASD closure  . Right vein ligation  2009  . Cervical biopsy    . Left breast biopsy    . Colonoscopy    . Cardiac catheterization  2008  . 2d echo with bubble study  03/31/12  . Parathyroid exploration  04/05/2012    Procedure: PARATHYROID EXPLORATION;  Surgeon: Valarie Merino, MD;  Location: [facility] OR;  Service: General;  Laterality: N/A;  Exploration of parathryroid and removal of left and right interior parathyroid   Family History  Problem Relation Age of Onset  . Cancer Mother  leukemia  . Cancer Maternal Uncle     leukemia  . Cancer Maternal Grandfather     stomach   History  Substance Use Topics  . Smoking status: Never Smoker   . Smokeless tobacco: Never Used  . Alcohol Use: No   OB History   Grav Para Term Preterm Abortions TAB SAB Ect Mult Living                 Review of Systems  Constitutional: Negative for fever.  HENT: Negative for congestion and sore throat.   Eyes: Negative for visual disturbance.  Respiratory: Negative for shortness of breath.   Cardiovascular: Negative for chest pain.  Gastrointestinal: Negative for vomiting and abdominal pain.  Genitourinary: Negative for difficulty urinating.  Musculoskeletal: Negative for back pain.  Skin: Positive for rash.  Neurological: Negative for headaches.  All other systems  reviewed and are negative.    Allergies  Erythromycin; Lidocaine; Other; Prilosec; Statins; and Vicodin  Home Medications   Current Outpatient Rx  Name  Route  Sig  Dispense  Refill  . aspirin 325 MG tablet   Oral   Take 325 mg by mouth daily.         Marland Kitchen escitalopram (LEXAPRO) 10 MG tablet   Oral   Take 10 mg by mouth daily.         . furosemide (LASIX) 40 MG tablet   Oral   Take 40-80 mg by mouth daily. Takes 40 mg daily, takes an extra tablet if swelling is increased.         Marland Kitchen KLOR-CON M10 10 MEQ tablet   Oral   Take 10-20 mEq by mouth daily. Takes 10 meq daily unless she takes extra furosemide then she takes 20 meq.         Marland Kitchen levothyroxine (SYNTHROID, LEVOTHROID) 125 MCG tablet   Oral   Take 125 mcg by mouth daily.         . meloxicam (MOBIC) 15 MG tablet   Oral   Take 15 mg by mouth daily as needed. Muscle pain.         . metoprolol succinate (TOPROL-XL) 25 MG 24 hr tablet   Oral   Take 25 mg by mouth daily.          . Multiple Vitamins-Minerals (CENTRUM SILVER PO)   Oral   Take by mouth.         . niacin (NIASPAN) 1000 MG CR tablet               . Pitavastatin Calcium (LIVALO) 2 MG TABS   Oral   Take 1 tablet by mouth every other day.         . traMADol (ULTRAM) 50 MG tablet   Oral   Take 50 mg by mouth every 6 (six) hours as needed. pain          Triage Vitals: BP 171/86  Pulse 91  Temp(Src) 98.3 F (36.8 C) (Oral)  Resp 18  Ht 5\' 6"  (1.676 m)  Wt 293 lb (132.904 kg)  BMI 47.31 kg/m2  SpO2 97% Physical Exam  Nursing note and vitals reviewed. Constitutional: She is oriented to person, place, and time. She appears well-developed and well-nourished.  HENT:  Head: Normocephalic and atraumatic.  Mouth/Throat: Oropharynx is clear and moist and mucous membranes are normal. No oropharyngeal exudate.  Eyes: Conjunctivae are normal. Pupils are equal, round, and reactive to light.  Neck: Normal range of motion. Neck supple.  Cardiovascular: Normal rate, regular rhythm and normal heart sounds.   Pulmonary/Chest: Effort normal and breath sounds normal. She has no wheezes. She has no rales.  Abdominal: Soft. Bowel sounds are normal. She exhibits no distension. There is no tenderness. There is no rebound.  Musculoskeletal: Normal range of motion.  Neurological: She is alert and oriented to person, place, and time.  Skin: Skin is warm and dry. Rash noted. Rash is papular.  Red area under breast fold with satellite papules cw candida  Psychiatric: She has a normal mood and affect. Her behavior is normal.    ED Course  Procedures (including critical care time) DIAGNOSTIC STUDIES:  COORDINATION OF CARE: 12:04 AM-  Advised pt to get Lamisil over the counter for treatment of candida under breasts. Pt verbalizes understanding and agrees to plan.    MDM   1. Candidal skin infection    Use lamisil cream on the affected area  I personally performed the services described in this documentation, which was scribed in my presence. The recorded information has been reviewed and is accurate.    Amber Awe, MD 11/20/12 [phone]

## 2012-11-19 NOTE — Progress Notes (Signed)
Patient ID: Amber Cook, female   DOB: 04/18/1945, 67 y.o.   MRN: [id number]   HPI: Amber Cook, is a 67 y.o. female who presents to the office today for sleep clinic evaluation following CPAP therapy.  Amber Cook is a 67 year old female who has a history of ASD repair by Dr. Nydia Bouton, obesity, hyperlipidemia, venous insufficiency, and is status post ablation of right greater saphenous vein graft. She has significant obstructive sleep apnea on CPAP therapy. She is status post parathyroidectomy for primary hyperparathyroidism with resolution of hypercalcemia. She had undergone recent sleep evaluation prior to her parathyroid surgery which confirmed moderate sleep apnea overall with an HIV 17.2 but severe sleep apnea during REM sleep with an AHI of 82.3. Following her most recent sleep study, I did recommend changing her CPAP therapy to CPAP although unit with a minimum pressure of 1012 maximum pressure of 20 cm water pressure with heated humidification to  A. download which had been obtained from 06/10/2012 through 07/12/2012 when she was on a set pressure of 9 cm revealed an AHI of 3 since that time, she has been set to an auto unit due to her positional component of her and sensation of inadequate pressures. Recently, she states that at time she is still having the sensation that she may not getting adequate care. I do not have a download on her auto settings. She does continue to notice leg swelling. She is unaware of nocturnal tachypalpitations. With reference to her hyperlipidemia she has been able to tolerate Livalo.   Epworth Sleepiness Scale: Situation   Chance of Dozing/Sleeping (0 = never , 1 = slight chance , 2 = moderate chance , 3 = high chance )   sitting and reading 1   watching TV 1   sitting inactive in a public place 1   being a passenger in a motor vehicle for an hour or more 3   lying down in the afternoon 3   sitting and talking to someone 0   sitting quietly after  lunch (no alcohol) 0   while stopped for a few minutes in traffic as the driver 0   Total Score  9    Past Medical History  Diagnosis Date  . Diastolic dysfunction 03/31/12    grade 1  . Hyperlipidemia     takes Welchol bid   . Hypertension     takes Metoprolol nightly   . Pneumonia     hx of->a yr ago  . History of bronchitis     a month ago  . Sleep apnea     supposed to use CPAP;repeat study tonight @ [address]  . Peripheral neuropathy   . Hypercalcemia   . Elevated parathyroid hormone   . Arthritis   . Vaginal yeast infection   . Diabetes mellitus without complication     borderline  . GERD (gastroesophageal reflux disease)     but doesn't require meds  . Colitis   . Internal hemorrhoids   . Urinary frequency     takes Lasix daily  . Hypothyroidism     takes Synthroid daily  . Depression     takes Lexapro daily  . ASD (atrial septal defect), ostium secundum     surgical repair 7/08  . Venous insufficiency     SVG ablation    Past Surgical History  Procedure Laterality Date  . Tonsillectomy and adenoidectomy      and adenoids at age 61  . Atral septal defect repair  July 2009    after failed ASD closure  . Right vein ligation  2009  . Cervical biopsy    . Left breast biopsy    . Colonoscopy    . Cardiac catheterization  2008  . 2d echo with bubble study  03/31/12  . Parathyroid exploration  04/05/2012    Procedure: PARATHYROID EXPLORATION;  Surgeon: Valarie Merino, MD;  Location: [facility] OR;  Service: General;  Laterality: N/A;  Exploration of parathryroid and removal of left and right interior parathyroid    Allergies  Allergen Reactions  . Erythromycin Nausea And Vomiting    Stomach pain  . Lidocaine   . Other     pomagranite--tongue itching  Nuts-tongue with a funny feeling  . Prilosec [Omeprazole]   . Statins Other (See Comments)    Muscle cramps.   . Vicodin [Hydrocodone-Acetaminophen]     Current Outpatient Prescriptions  Medication Sig  Dispense Refill  . aspirin 325 MG tablet Take 325 mg by mouth daily.      Marland Kitchen escitalopram (LEXAPRO) 10 MG tablet Take 10 mg by mouth daily.      . furosemide (LASIX) 40 MG tablet Take 40-80 mg by mouth daily. Takes 40 mg daily, takes an extra tablet if swelling is increased.      Marland Kitchen KLOR-CON M10 10 MEQ tablet Take 10-20 mEq by mouth daily. Takes 10 meq daily unless she takes extra furosemide then she takes 20 meq.      Marland Kitchen levothyroxine (SYNTHROID, LEVOTHROID) 125 MCG tablet Take 125 mcg by mouth daily.      . meloxicam (MOBIC) 15 MG tablet Take 15 mg by mouth daily as needed. Muscle pain.      . metoprolol succinate (TOPROL-XL) 25 MG 24 hr tablet Take 25 mg by mouth daily.       . Multiple Vitamins-Minerals (CENTRUM SILVER PO) Take by mouth.      . niacin (NIASPAN) 1000 MG CR tablet       . Pitavastatin Calcium (LIVALO) 2 MG TABS Take 1 tablet by mouth every other day.      . traMADol (ULTRAM) 50 MG tablet Take 50 mg by mouth every 6 (six) hours as needed. pain       No current facility-administered medications for this visit.    Socially she is single has no children. She does not exercise. No tobacco or alcohol use.  ROS negative for fever, chills or night sweats. She denies presyncope or syncope. She denies recent indigestion. Has no significant weight loss. She does continue to admit to leg swelling. She apparently did not take her Lasix today. She is unaware of breakthrough snoring. She denies restless legs. She denies bruxism. Her daytime sleepiness has improved with CPAP therapy. Other system review is negative.  PE BP 160/90  Pulse 74  Ht 5' 6.34" (1.685 m)  Wt 293 lb 4.8 oz (133.04 kg)  BMI 46.86 kg/m2  General: Alert, oriented, no distress.  Skin: normal turgor, no rashes HEENT: Normocephalic, atraumatic. Pupils round and reactive; sclera anicteric;  Nose without nasal septal hypertrophy Mouth/Parynx benign; Mallinpatti scale 3 Neck: No JVD, no carotid briuts Lungs: clear to  ausculatation and percussion; no wheezing or rales Heart: RRR, s1 s2 normal  Abdomen: soft, nontender; no hepatosplenomehaly, BS+; abdominal aorta nontender and not dilated by palpation. Pulses 2+ Extremities:1-2+ lower extremity edema, no clubbinbg cyanosis, Homan's sign negative  Neurologic: grossly nonfocal    I did interrogate her CPAP unit from 10/19/2012 through 11/17/2012. She  is set at an auto set at 10-20 cm water pressure with CPR 3. Her 95% pressure was 13.9. HIV 3.0 per hour, her leak was elevated at 24. Usage was 76% greater than 4 hours.   LABS:  BMET    Component Value Date/Time   NA 139 04/21/2012 1234   K 4.3 04/21/2012 1234   CL 104 04/21/2012 1234   CO2 28 04/21/2012 1234   GLUCOSE 115* 04/21/2012 1234   BUN 18 04/21/2012 1234   CREATININE 1.14* 04/21/2012 1234   CREATININE 0.86 04/07/2012 0650   CALCIUM 9.1 04/21/2012 1234   CALCIUM 10.5 01/06/2012 1630   GFRNONAA 68* 04/07/2012 0650   GFRAA 79* 04/07/2012 0650     Hepatic Function Panel     Component Value Date/Time   PROT 6.7 04/21/2012 1234   ALBUMIN 3.7 04/21/2012 1234   AST 20 04/21/2012 1234   ALT 25 04/21/2012 1234   ALKPHOS 102 04/21/2012 1234   BILITOT 0.4 04/21/2012 1234     CBC    Component Value Date/Time   WBC 6.0 04/07/2012 0650   RBC 4.07 04/07/2012 0650   HGB 12.9 04/07/2012 0650   HCT 39.3 04/07/2012 0650   PLT 204 04/07/2012 0650   MCV 96.6 04/07/2012 0650   MCH 31.7 04/07/2012 0650   MCHC 32.8 04/07/2012 0650   RDW 13.1 04/07/2012 0650     BNP No results found for this basename: probnp    Lipid Panel     Component Value Date/Time   CHOL 279* 04/01/2012 1436   CHOL 282* 04/01/2012 1436   TRIG 256* 04/01/2012 1436   TRIG 267* 04/01/2012 1436   HDL 44 04/01/2012 1436   HDL 45 04/01/2012 1436   CHOLHDL 6.3 04/01/2012 1436   CHOLHDL 6.3 04/01/2012 1436   VLDL 51* 04/01/2012 1436   VLDL 53* 04/01/2012 1436   LDLCALC 184* 04/01/2012 1436   LDLCALC 184* 04/01/2012 1436     RADIOLOGY: No  results found.    ASSESSMENT AND PLAN: Amber Cook is status post atrial septal defect repair, she has a history of venous insufficiency, status post venous ablation for right greater saphenous vein graft. She continues to have leg swelling. I have recommended she resume taking her Lasix which she did not take today. With reference to her sleep apnea, I will try to obtain a subsequent download from [facility] now that she is in a CPAP Auto mode. However, I did investigate her equipment today and I suspect her mask seal is the reason for her significant leak. Her seal is entirely worn out.  She was given information concerning cleansing and she needs to have a new seal mask  From [facility].  I will obtain a new download in approximately one month to ascertain if this has rectify her problem.     Lennette Bihari, MD, [facility]  11/19/2012 10:36 AM

## 2012-11-20 NOTE — ED Notes (Signed)
MD at bedside. 

## 2012-12-20 ENCOUNTER — Encounter (INDEPENDENT_AMBULATORY_CARE_PROVIDER_SITE_OTHER): Payer: Self-pay

## 2013-04-04 ENCOUNTER — Encounter: Payer: Self-pay | Admitting: Cardiovascular Disease

## 2013-04-04 ENCOUNTER — Telehealth: Payer: Self-pay | Admitting: Cardiovascular Disease

## 2013-04-04 NOTE — Telephone Encounter (Signed)
Per Caren Griffins in Medical Records, EKG was received and given to [place], [facility] w/ Dr. Sallyanne Kuster.

## 2013-04-04 NOTE — Telephone Encounter (Signed)
Message forwarded to Medical Records to locate EKG and place on Dr. Victorino December cart.  Message forwarded to Dr. Sallyanne Kuster

## 2013-04-04 NOTE — Telephone Encounter (Signed)
ECG is unchanged from her last one

## 2013-04-04 NOTE — Telephone Encounter (Signed)
Pt called again and wanted to know if  DR C have looked at her ekg,she is very anxious.

## 2013-04-04 NOTE — Telephone Encounter (Signed)
She had an ekg yesterday at Dr Sharyon Cable was sending it here so Dr C could see it and compare it with her last ekg. She still is waiting to hear something.

## 2013-04-06 NOTE — Telephone Encounter (Signed)
EKG was in EPIC, made a copy and put it on Dr. Lurline Del cart. ST

## 2013-04-06 NOTE — Telephone Encounter (Signed)
Returned call.  Left message to call back before 4pm.  

## 2013-04-06 NOTE — Telephone Encounter (Signed)
Returned call and informed pt per instructions by MD.  Pt verbalized understanding and agreed w/ plan.  

## 2013-04-06 NOTE — Telephone Encounter (Signed)
Returning ambers call °

## 2013-05-07 ENCOUNTER — Encounter (HOSPITAL_BASED_OUTPATIENT_CLINIC_OR_DEPARTMENT_OTHER): Payer: Self-pay | Admitting: Emergency Medicine

## 2013-05-07 ENCOUNTER — Emergency Department (HOSPITAL_BASED_OUTPATIENT_CLINIC_OR_DEPARTMENT_OTHER)
Admission: EM | Admit: 2013-05-07 | Discharge: 2013-05-08 | Disposition: A | Discharge status: Home / Self Care | Payer: Medicare Other | Attending: Emergency Medicine | Admitting: Emergency Medicine

## 2013-05-07 DIAGNOSIS — R109 Unspecified abdominal pain: Secondary | ICD-10-CM | POA: Insufficient documentation

## 2013-05-07 DIAGNOSIS — B029 Zoster without complications: Secondary | ICD-10-CM | POA: Insufficient documentation

## 2013-05-07 DIAGNOSIS — M2540 Effusion, unspecified joint: Secondary | ICD-10-CM | POA: Insufficient documentation

## 2013-05-07 DIAGNOSIS — Z8701 Personal history of pneumonia (recurrent): Secondary | ICD-10-CM | POA: Insufficient documentation

## 2013-05-07 DIAGNOSIS — Z79899 Other long term (current) drug therapy: Secondary | ICD-10-CM | POA: Insufficient documentation

## 2013-05-07 DIAGNOSIS — E039 Hypothyroidism, unspecified: Secondary | ICD-10-CM | POA: Insufficient documentation

## 2013-05-07 DIAGNOSIS — Z9889 Other specified postprocedural states: Secondary | ICD-10-CM | POA: Insufficient documentation

## 2013-05-07 DIAGNOSIS — F329 Major depressive disorder, single episode, unspecified: Secondary | ICD-10-CM | POA: Insufficient documentation

## 2013-05-07 DIAGNOSIS — E785 Hyperlipidemia, unspecified: Secondary | ICD-10-CM | POA: Insufficient documentation

## 2013-05-07 DIAGNOSIS — F3289 Other specified depressive episodes: Secondary | ICD-10-CM | POA: Insufficient documentation

## 2013-05-07 DIAGNOSIS — J3489 Other specified disorders of nose and nasal sinuses: Secondary | ICD-10-CM | POA: Insufficient documentation

## 2013-05-07 DIAGNOSIS — I10 Essential (primary) hypertension: Secondary | ICD-10-CM | POA: Insufficient documentation

## 2013-05-07 DIAGNOSIS — M549 Dorsalgia, unspecified: Secondary | ICD-10-CM | POA: Insufficient documentation

## 2013-05-07 DIAGNOSIS — Z7982 Long term (current) use of aspirin: Secondary | ICD-10-CM | POA: Insufficient documentation

## 2013-05-07 DIAGNOSIS — E119 Type 2 diabetes mellitus without complications: Secondary | ICD-10-CM | POA: Insufficient documentation

## 2013-05-07 DIAGNOSIS — M129 Arthropathy, unspecified: Secondary | ICD-10-CM | POA: Insufficient documentation

## 2013-05-07 DIAGNOSIS — Z8719 Personal history of other diseases of the digestive system: Secondary | ICD-10-CM | POA: Insufficient documentation

## 2013-05-07 DIAGNOSIS — G4730 Sleep apnea, unspecified: Secondary | ICD-10-CM | POA: Insufficient documentation

## 2013-05-07 DIAGNOSIS — Z8669 Personal history of other diseases of the nervous system and sense organs: Secondary | ICD-10-CM | POA: Insufficient documentation

## 2013-05-07 MED ORDER — PREDNISONE 10 MG PO TABS: 40.0000 mg | ORAL_TABLET | Freq: Every day | ORAL | Status: DC

## 2013-05-07 MED ORDER — ACYCLOVIR 400 MG PO TABS: 800.0000 mg | ORAL_TABLET | Freq: Every day | ORAL | Status: DC

## 2013-05-07 MED ORDER — PREDNISONE 10 MG PO TABS
60.0000 mg | ORAL_TABLET | Freq: Once | ORAL | Status: AC
  Administered 2013-05-07: 60 mg via ORAL
  Filled 2013-05-07 (×2): qty 1

## 2013-05-07 NOTE — ED Notes (Signed)
Reports painful rash to right flank x 1 day- ?shingles

## 2013-05-07 NOTE — Discharge Instructions (Signed)
Shingles Shingles is caused by the same virus that causes chickenpox. The first feelings may be pain or tingling. A rash will follow in a couple days. The rash may occur on any area of the body. Long-lasting pain is more likely in an elderly person. It can last months to years. There are medicines that can help prevent pain if you start taking them early. HOME CARE   Place cool cloths on the rash.  Only take medicine as told by your doctor.  You may use calamine lotion to relieve itchy skin.  Avoid touching:  Babies.  Children with inflamed skin (eczema).  People who have gotten transplanted organs.  People with chronic illnesses, such as leukemia and AIDS.  If the rash is on the face, you may need to see a specialist. Keep all appointments. Shingles must be kept away from the eyes, if possible.  Keep all appointments.  Avoid touching the eyes or eye area, if possible. GET HELP RIGHT AWAY IF:   You have any pain on the face or eye.  Your medicines do not help.  Your redness or puffiness (swelling) spreads.  You have a fever.  You notice any red lines going away from the rash area. MAKE SURE YOU:   Understand these instructions.  Will watch your condition.  Will get help right away if you are not doing well or get worse. Document Released: 08/26/2007 Document Revised: 06/01/2011 Document Reviewed: 08/26/2007 [facility] Patient Information 2014 [place], [place].  Take acyclovir to help the shingles as directed. Take prednisone to help with the discomfort. Follow up with your doctor in the next few days. Return for any newer worse symptoms.

## 2013-05-07 NOTE — ED Provider Notes (Signed)
CSN: [id number]     Arrival date & time 05/07/13  2029 History   First MD Initiated Contact with Patient 05/07/13 2241     This chart was scribed for Mervin Kung, MD by Forrestine Him, ED Scribe. This patient was seen in room MH05/MH05 and the patient's care was started 10:56 PM.   Chief Complaint  Patient presents with  . Rash   Patient is a 68 y.o. female presenting with rash. The history is provided by the patient. No language interpreter was used.  Rash Location:  Torso Torso rash location:  R flank Quality: painful   Pain details:    Severity:  Severe   Onset quality:  Sudden   Duration:  1 day   Timing:  Constant   Progression:  Worsening Severity:  Severe Onset quality:  Sudden Duration:  1 day Timing:  Constant Progression:  Worsening Chronicity:  New Relieved by:  Nothing Worsened by:  Nothing tried Ineffective treatments:  None tried Associated symptoms: no abdominal pain, no diarrhea, no fever, no headaches, no nausea, no shortness of breath, no sore throat and not vomiting     HPI Comments: Amber Cook is a 68 y.o. Female with multiple medical problems who presents to the Emergency Department complaining of a sudden onset, gradually worsening, painful rash to the right lower flank onset 1 day. Pt states she noted the rash progressively worsening overtime. She denies getting the shingles vaccination. Pt reports recently getting back from a cruise and states she experienced some mild cold like symptoms while driving back from the port. Pt has no other complaints or concerns at this time.  Past Medical History  Diagnosis Date  . Diastolic dysfunction 0000000    grade 1  . Hyperlipidemia     takes Welchol bid   . Hypertension     takes Metoprolol nightly   . Pneumonia     hx of->a yr ago  . History of bronchitis     a month ago  . Sleep apnea     supposed to use CPAP;repeat study tonight @ [place]  . Peripheral neuropathy   . Hypercalcemia   .  Elevated parathyroid hormone   . Arthritis   . Vaginal yeast infection   . Diabetes mellitus without complication     borderline  . GERD (gastroesophageal reflux disease)     but doesn't require meds  . Colitis   . Internal hemorrhoids   . Urinary frequency     takes Lasix daily  . Hypothyroidism     takes Synthroid daily  . Depression     takes Lexapro daily  . ASD (atrial septal defect), ostium secundum     surgical repair 7/08  . Venous insufficiency     SVG ablation   Past Surgical History  Procedure Laterality Date  . Tonsillectomy and adenoidectomy      and adenoids at age 85  . Atral septal defect repair  July 2009    after failed ASD closure  . Right vein ligation  2009  . Cervical biopsy    . Left breast biopsy    . Colonoscopy    . Cardiac catheterization  2008  . 2d echo with bubble study  03/31/12  . Parathyroid exploration  04/05/2012    Procedure: PARATHYROID EXPLORATION;  Surgeon: Pedro Earls, MD;  Location: [place];  Service: General;  Laterality: N/A;  Exploration of parathryroid and removal of left and right interior parathyroid  . Parathyroidectomy  Family History  Problem Relation Age of Onset  . Cancer Mother     leukemia  . Cancer Maternal Uncle     leukemia  . Cancer Maternal Grandfather     stomach   History  Substance Use Topics  . Smoking status: Never Smoker   . Smokeless tobacco: Never Used  . Alcohol Use: No   OB History   Grav Para Term Preterm Abortions TAB SAB Ect Mult Living                 Review of Systems  Constitutional: Negative for fever and chills.  HENT: Positive for congestion. Negative for rhinorrhea and sore throat.   Eyes: Negative for visual disturbance.  Respiratory: Negative for cough and shortness of breath.   Cardiovascular: Negative for chest pain.  Gastrointestinal: Negative for nausea, vomiting, abdominal pain and diarrhea.  Genitourinary: Positive for flank pain. Negative for dysuria.   Musculoskeletal: Positive for back pain and joint swelling. Negative for neck pain.  Skin: Positive for rash.  Neurological: Negative for headaches.  Hematological: Does not bruise/bleed easily.  Psychiatric/Behavioral: Negative for confusion.      Allergies  Erythromycin; Lidocaine; Other; Prilosec; Statins; and Vicodin  Home Medications   Current Outpatient Rx  Name  Route  Sig  Dispense  Refill  . aspirin 325 MG tablet   Oral   Take 325 mg by mouth daily.         . colesevelam (WELCHOL) 625 MG tablet   Oral   Take 1,875 mg by mouth 2 (two) times daily with a meal.         . escitalopram (LEXAPRO) 10 MG tablet   Oral   Take 10 mg by mouth daily.         . furosemide (LASIX) 40 MG tablet   Oral   Take 40-80 mg by mouth daily. Takes 40 mg daily, takes an extra tablet if swelling is increased.         Marland Kitchen KLOR-CON M10 10 MEQ tablet   Oral   Take 10-20 mEq by mouth daily. Takes 10 meq daily unless she takes extra furosemide then she takes 20 meq.         Marland Kitchen levothyroxine (SYNTHROID, LEVOTHROID) 125 MCG tablet   Oral   Take 125 mcg by mouth daily.         . metoprolol succinate (TOPROL-XL) 25 MG 24 hr tablet   Oral   Take 25 mg by mouth daily.          . niacin (NIASPAN) 1000 MG CR tablet               . traMADol (ULTRAM) 50 MG tablet   Oral   Take 50 mg by mouth every 6 (six) hours as needed. pain         . acyclovir (ZOVIRAX) 400 MG tablet   Oral   Take 2 tablets (800 mg total) by mouth 5 (five) times daily.   50 tablet   0   . meloxicam (MOBIC) 15 MG tablet   Oral   Take 15 mg by mouth daily as needed. Muscle pain.         . Multiple Vitamins-Minerals (CENTRUM SILVER PO)   Oral   Take by mouth.         . Pitavastatin Calcium (LIVALO) 2 MG TABS   Oral   Take 1 tablet by mouth every other day.         Marland Kitchen  predniSONE (DELTASONE) 10 MG tablet   Oral   Take 4 tablets (40 mg total) by mouth daily.   20 tablet   0    Triage  Vitals: BP 116/49  Pulse 83  Temp(Src) 98.8 F (37.1 C) (Oral)  Resp 20  Ht 5\' 6"  (1.676 m)  Wt 299 lb (135.626 kg)  BMI 48.28 kg/m2  SpO2 97%  Physical Exam  Nursing note and vitals reviewed. Constitutional: She is oriented to person, place, and time. She appears well-developed and well-nourished.  HENT:  Head: Normocephalic and atraumatic.  Eyes: EOM are normal.  Neck: Normal range of motion.  Cardiovascular: Normal rate, regular rhythm and normal heart sounds.   Pulmonary/Chest: Effort normal and breath sounds normal.  Abdominal: Soft. Bowel sounds are normal. She exhibits no distension. There is no tenderness. There is no rebound and no guarding.  Musculoskeletal: Normal range of motion.  Neurological: She is alert and oriented to person, place, and time. No cranial nerve deficit. She exhibits normal muscle tone. Coordination normal.  Skin: Skin is warm and dry. Rash noted.  Vascular erythematous rash to right lower flank  Psychiatric: She has a normal mood and affect. Her behavior is normal.    ED Course  Procedures (including critical care time)  DIAGNOSTIC STUDIES: Oxygen Saturation is 97% on RA., Normal by my interpretation.    COORDINATION OF CARE: 10:56 PM-Discussed treatment plan with pt at bedside and pt agreed to plan.     Labs Review Labs Reviewed - No data to display Imaging Review No results found.  EKG Interpretation   None       MDM   Final diagnoses:  Shingles    Symptoms consistent an exam consistent with right-sided shingles L1 dermatome. Will treat with acyclovir and prednisone. Followup with her doctor. Patient is nontoxic no acute distress. Patient has not had the shingles immunization.  I personally performed the services described in this documentation, which was scribed in my presence. The recorded information has been reviewed and is accurate.    Mervin Kung, MD 05/07/13 [phone]

## 2013-06-08 ENCOUNTER — Other Ambulatory Visit: Payer: Self-pay | Admitting: Cardiovascular Disease

## 2013-06-08 NOTE — Telephone Encounter (Signed)
Rx was sent to pharmacy electronically. 

## 2013-06-19 ENCOUNTER — Encounter: Payer: Self-pay | Admitting: Cardiovascular Disease

## 2013-06-19 ENCOUNTER — Ambulatory Visit (INDEPENDENT_AMBULATORY_CARE_PROVIDER_SITE_OTHER): Payer: Medicare Other | Admitting: Cardiovascular Disease

## 2013-06-19 VITALS — BP 142/84 | HR 74 | Resp 20 | Ht 66.0 in | Wt 301.3 lb

## 2013-06-19 DIAGNOSIS — E782 Mixed hyperlipidemia: Secondary | ICD-10-CM

## 2013-06-19 DIAGNOSIS — M25512 Pain in left shoulder: Secondary | ICD-10-CM | POA: Insufficient documentation

## 2013-06-19 DIAGNOSIS — Z9989 Dependence on other enabling machines and devices: Secondary | ICD-10-CM

## 2013-06-19 DIAGNOSIS — M25519 Pain in unspecified shoulder: Secondary | ICD-10-CM

## 2013-06-19 DIAGNOSIS — Q2111 Secundum atrial septal defect: Secondary | ICD-10-CM

## 2013-06-19 DIAGNOSIS — I872 Venous insufficiency (chronic) (peripheral): Secondary | ICD-10-CM

## 2013-06-19 DIAGNOSIS — E785 Hyperlipidemia, unspecified: Secondary | ICD-10-CM

## 2013-06-19 DIAGNOSIS — G4733 Obstructive sleep apnea (adult) (pediatric): Secondary | ICD-10-CM

## 2013-06-19 DIAGNOSIS — Q211 Atrial septal defect: Secondary | ICD-10-CM

## 2013-06-19 DIAGNOSIS — Z79899 Other long term (current) drug therapy: Secondary | ICD-10-CM

## 2013-06-19 MED ORDER — NIACIN ER 1000 MG PO TBCR
1000.0000 mg | EXTENDED_RELEASE_TABLET | Freq: Every day | ORAL | Status: DC
Start: 2013-06-19 — End: 2015-01-25

## 2013-06-19 MED ORDER — FUROSEMIDE 40 MG PO TABS: 80.0000 mg | ORAL_TABLET | Freq: Every day | ORAL | Status: DC

## 2013-06-19 MED ORDER — METOPROLOL SUCCINATE ER 25 MG PO TB24: 25.0000 mg | ORAL_TABLET | Freq: Every day | ORAL | Status: DC

## 2013-06-19 MED ORDER — COLESEVELAM HCL 625 MG PO TABS: 1875.0000 mg | ORAL_TABLET | Freq: Two times a day (BID) | ORAL | Status: DC

## 2013-06-19 MED ORDER — POTASSIUM CHLORIDE CRYS ER 10 MEQ PO TBCR: EXTENDED_RELEASE_TABLET | ORAL | Status: DC

## 2013-06-19 NOTE — Progress Notes (Signed)
Patient ID: Amber Cook, female   DOB: 1945-06-24, 68 y.o.   MRN: [id number]      Reason for office visit History of atrial septal defect, obstructive sleep apnea, hypertension, dyslipidemia  Amber Cook is now 68 years old and is roughly 7 years status post surgical repair of a secundum atrial septal defect after a failed attempt at percutaneous transvenous device closure. She is obese and has obstructive sleep apnea and is moderately compliant with CPAP therapy. She wears CPAP every night but only for roughly 4 hours a night. She has hyperlipidemia and has been intolerant to numerous statins as well as Zetia. Most recently she tried Livalo unsuccessfully. She is therefore taking WelChol instead. She had a normal coronary angiogram in 2008 and her echocardiogram in generates 2014 showed a normal left ventricle systolic function (EF 46-96%, abnormal relaxation, normal LA pressures). No residual ASD was seen. The right heart chambers are normal in size and RV systolic function was normal. The estimated systolic PA pressure was 35 mm Hg.  Her major problem is ankle swelling. She has varicose veins as well and has undergone previous venous ablation. On many nights the edema resolves almost completely overnight but sometimes edema persists the following morning. He has chronic exertional dyspnea, NYHA functional class II, unchanged from last year.  Roughly one month ago she had an episode of sudden severe pain in her left scapular region. She was carrying a bowl of food on each hand in the sudden severity of the pain made her drop the one in her left hand. The pain radiated up the back of her neck. Her echocardiogram is performed in her physician's office on that date was normal. She has had one more episode of similar pain that occurred at rest. The pain is described as sharp. Nonsteroidal anti-inflammatory drugs lead to improvement in the symptoms   Allergies  Allergen Reactions  . Erythromycin  Nausea And Vomiting    Stomach pain  . Lidocaine   . Other     pomagranite--tongue itching  Nuts-tongue with a funny feeling  . Prilosec [Omeprazole]   . Statins Other (See Comments)    Muscle cramps.   . Vicodin [Hydrocodone-Acetaminophen]     Current Outpatient Prescriptions  Medication Sig Dispense Refill  . aspirin 325 MG tablet Take 325 mg by mouth daily.      . colesevelam (WELCHOL) 625 MG tablet Take 3 tablets (1,875 mg total) by mouth 2 (two) times daily with a meal.  540 tablet  3  . escitalopram (LEXAPRO) 10 MG tablet Take 10 mg by mouth daily.      . furosemide (LASIX) 40 MG tablet Take 2 tablets (80 mg total) by mouth daily. Takes 40 mg daily, takes an extra tablet if swelling is increased.  90 tablet  3  . levothyroxine (SYNTHROID, LEVOTHROID) 125 MCG tablet Take 125 mcg by mouth daily.      . meloxicam (MOBIC) 15 MG tablet Take 15 mg by mouth daily as needed. Muscle pain.      . metoprolol succinate (TOPROL-XL) 25 MG 24 hr tablet Take 1 tablet (25 mg total) by mouth daily.  90 tablet  3  . Niacin CR 1000 MG TBCR Take 1 tablet (1,000 mg total) by mouth daily.  90 tablet  3  . potassium chloride (KLOR-CON M10) 10 MEQ tablet Takes 10-20 meq daily unless she takes extra furosemide then she takes 30 meq.  270 tablet  3  . traMADol (ULTRAM) 50 MG  tablet Take 50 mg by mouth every 6 (six) hours as needed. pain      . [DISCONTINUED] niacin (NIASPAN) 1000 MG CR tablet        No current facility-administered medications for this visit.    Past Medical History  Diagnosis Date  . Diastolic dysfunction 9/38/10    grade 1  . Hyperlipidemia     takes Welchol bid   . Hypertension     takes Metoprolol nightly   . Pneumonia     hx of->a yr ago  . History of bronchitis     a month ago  . Sleep apnea     supposed to use CPAP;repeat study tonight @ [place]  . Peripheral neuropathy   . Hypercalcemia   . Elevated parathyroid hormone   . Arthritis   . Vaginal yeast infection    . Diabetes mellitus without complication     borderline  . GERD (gastroesophageal reflux disease)     but doesn't require meds  . Colitis   . Internal hemorrhoids   . Urinary frequency     takes Lasix daily  . Hypothyroidism     takes Synthroid daily  . Depression     takes Lexapro daily  . ASD (atrial septal defect), ostium secundum     surgical repair 7/08  . Venous insufficiency     SVG ablation    Past Surgical History  Procedure Laterality Date  . Tonsillectomy and adenoidectomy      and adenoids at age 61  . Atral septal defect repair  July 2009    after failed ASD closure  . Right vein ligation  2009  . Cervical biopsy    . Left breast biopsy    . Colonoscopy    . Cardiac catheterization  2008  . 2d echo with bubble study  03/31/12  . Parathyroid exploration  04/05/2012    Procedure: PARATHYROID EXPLORATION;  Surgeon: Pedro Earls, MD;  Location: [place];  Service: General;  Laterality: N/A;  Exploration of parathryroid and removal of left and right interior parathyroid  . Parathyroidectomy      Family History  Problem Relation Age of Onset  . Cancer Mother     leukemia  . Cancer Maternal Uncle     leukemia  . Cancer Maternal Grandfather     stomach    History   Social History  . Marital Status: Single    Spouse Name: N/A    Number of Children: N/A  . Years of Education: N/A   Occupational History  . Not on file.   Social History Main Topics  . Smoking status: Never Smoker   . Smokeless tobacco: Never Used  . Alcohol Use: No  . Drug Use: No  . Sexual Activity: No   Other Topics Concern  . Not on file   Social History Narrative  . No narrative on file    Review of systems: The patient specifically denies any chest painwith exertion, dyspnea at rest, orthopnea, paroxysmal nocturnal dyspnea, syncope, palpitations, focal neurological deficits, intermittent claudication, lower extremity edema, unexplained weight gain, cough, hemoptysis or  wheezing.  The patient also denies abdominal pain, nausea, vomiting, dysphagia, diarrhea, constipation, polyuria, polydipsia, dysuria, hematuria, frequency, urgency, abnormal bleeding or bruising, fever, chills, unexpected weight changes, mood swings, change in skin or hair texture, change in voice quality, auditory or visual problems, allergic reactions or rashes, new musculoskeletal complaints other than usual "aches and pains".   PHYSICAL EXAM BP 142/84  Pulse 74  Ht $R'5\' 6"'Fr$  (1.676 m)  Wt 136.669 kg (301 lb 4.8 oz)  BMI 48.65 kg/m2  General: Alert, oriented x3, no distress, morbid obesity limits her examination Head: no evidence of trauma, PERRL, EOMI, no exophtalmos or lid lag, no myxedema, no xanthelasma; normal ears, nose and oropharynx Neck: normal jugular venous pulsations and no hepatojugular reflux; brisk carotid pulses without delay and no carotid bruits Chest: clear to auscultation, no signs of consolidation by percussion or palpation, normal fremitus, symmetrical and full respiratory excursions Cardiovascular: Cannot locate the apical impulse, no heaves or impulses, regular rhythm, normal first and slightly louder than normal second heart sounds, no murmurs, rubs or gallops Abdomen: no tenderness or distention, no masses by palpation, no abnormal pulsatility or arterial bruits, normal bowel sounds, no hepatosplenomegaly Extremities: no clubbing, cyanosis or edema; 2+ radial, ulnar and brachial pulses bilaterally; 2+ right femoral, posterior tibial and dorsalis pedis pulses; 2+ left femoral, posterior tibial and dorsalis pedis pulses; no subclavian or femoral bruits Neurological: grossly nonfocal   EKG: Sinus rhythm, deep but sharp Q waves in the inferior leads probably do not represent true infarction but are related to septal hypertrophy.Marland Kitchen are not different from previous tracings.  Lipid Panel     Component Value Date/Time   CHOL 279* 04/01/2012 1436   CHOL 282* 04/01/2012 1436     TRIG 256* 04/01/2012 1436   TRIG 267* 04/01/2012 1436   HDL 44 04/01/2012 1436   HDL 45 04/01/2012 1436   CHOLHDL 6.3 04/01/2012 1436   CHOLHDL 6.3 04/01/2012 1436   VLDL 51* 04/01/2012 1436   VLDL 53* 04/01/2012 1436   LDLCALC 184* 04/01/2012 1436   LDLCALC 184* 04/01/2012 1436    BMET    Component Value Date/Time   NA 139 04/21/2012 1234   K 4.3 04/21/2012 1234   CL 104 04/21/2012 1234   CO2 28 04/21/2012 1234   GLUCOSE 115* 04/21/2012 1234   BUN 18 04/21/2012 1234   CREATININE 1.14* 04/21/2012 1234   CREATININE 0.86 04/07/2012 0650   CALCIUM 9.1 04/21/2012 1234   CALCIUM 10.5 01/06/2012 1630   GFRNONAA 68* 04/07/2012 0650   GFRAA 79* 04/07/2012 0650     ASSESSMENT AND PLAN ASD (atrial septal defect), ostium secundum Reportedly the shunt fraction before the surgical closure was 1.9-1. It is therefore highly likely that she has some residual abnormality in pulmonary vascular resistance. There was no evidence of any ASD by most recent echocardiogram and the pulmonary artery pressure was only mildly elevated (not sure how accurate the assessment was).  OSA on CPAP Her less than perfect compliance with obstructive sleep apnea probably further worsen his pulmonary vascular resistance abnormalities. Suggested followup in the sleep clinic with Dr. Claiborne Billings. Encouraged 100% compliance.  Venous insufficiency I suspect that her pulmonary abnormalities causing right heart failure which is a major cause of her lower extremity edema, at least as much so as her venous insufficiency. Recommended that she increase her dose of furosemide to 120 mg daily (and also increase her potassium chloride to 30 mEq daily) when the edema becomes severe enough to cause skin problems or discomfort.  Hyperlipidemia She has fairly severe hypercholesterolemia and elevation in LDL cholesterol. I am sure that her lipid levels would not be in the target range just on treatment with a resin, but she has been intolerant to numerous  statins (both water and lipid soluble ones). Recheck her lipid profile now. Consider PSCK 9 inhibitors when they become available. On the  other hand it should be noted that she did not have any evidence of coronary plaque at the time of her cardiac catheterization a few years ago.  Left shoulder pain I believe this was musculoskeletal in etiology, she had normal coronary angiogram a few years ago. Her only real coronary risk factor is untreated hyperlipidemia. She has at most very mild hypertension, controlled with a tiny amount of beta blocker. I suggested a LexiScan Myoview, but did tell her that I suspected the study would be normal. She prefers not to undergo any additional testing at this time. Asked her to call me if the symptoms come back.   Orders Placed This Encounter  Procedures  . Lipid panel  . Comp Met (CMET)   Meds ordered this encounter  Medications  . furosemide (LASIX) 40 MG tablet    Sig: Take 2 tablets (80 mg total) by mouth daily. Takes 40 mg daily, takes an extra tablet if swelling is increased.    Dispense:  90 tablet    Refill:  3  . potassium chloride (KLOR-CON M10) 10 MEQ tablet    Sig: Takes 10-20 meq daily unless she takes extra furosemide then she takes 30 meq.    Dispense:  270 tablet    Refill:  3  . colesevelam (WELCHOL) 625 MG tablet    Sig: Take 3 tablets (1,875 mg total) by mouth 2 (two) times daily with a meal.    Dispense:  540 tablet    Refill:  3  . metoprolol succinate (TOPROL-XL) 25 MG 24 hr tablet    Sig: Take 1 tablet (25 mg total) by mouth daily.    Dispense:  90 tablet    Refill:  3  . Niacin CR 1000 MG TBCR    Sig: Take 1 tablet (1,000 mg total) by mouth daily.    Dispense:  90 tablet    Refill:  [place] Samar Dass, MD, [facility] [phone] office [phone] pager

## 2013-06-19 NOTE — Patient Instructions (Signed)
Take Lasix 80mg  daily.  You can increase to 120mg  daily as needed and increase Potassium to 46meq.  Your physician recommends that you schedule a follow-up appointment in: One Year.

## 2013-06-19 NOTE — Assessment & Plan Note (Signed)
She has fairly severe hypercholesterolemia and elevation in LDL cholesterol. I am sure that her lipid levels would not be in the target range just on treatment with a resin, but she has been intolerant to numerous statins (both water and lipid soluble ones). Recheck her lipid profile now. Consider PSCK 9 inhibitors when they become available. On the other hand it should be noted that she did not have any evidence of coronary plaque at the time of her cardiac catheterization a few years ago.

## 2013-06-19 NOTE — Assessment & Plan Note (Signed)
Her less than perfect compliance with obstructive sleep apnea probably further worsen his pulmonary vascular resistance abnormalities. Suggested followup in the sleep clinic with Dr. Claiborne Billings. Encouraged 100% compliance.

## 2013-06-19 NOTE — Assessment & Plan Note (Signed)
Reportedly the shunt fraction before the surgical closure was 1.9-1. It is therefore highly likely that she has some residual abnormality in pulmonary vascular resistance. There was no evidence of any ASD by most recent echocardiogram and the pulmonary artery pressure was only mildly elevated (not sure how accurate the assessment was).

## 2013-06-19 NOTE — Assessment & Plan Note (Signed)
I believe this was musculoskeletal in etiology, she had normal coronary angiogram a few years ago. Her only real coronary risk factor is untreated hyperlipidemia. She has at most very mild hypertension, controlled with a tiny amount of beta blocker. I suggested a LexiScan Myoview, but did tell her that I suspected the study would be normal. She prefers not to undergo any additional testing at this time. Asked her to call me if the symptoms come back.

## 2013-06-19 NOTE — Assessment & Plan Note (Signed)
I suspect that her pulmonary abnormalities causing right heart failure which is a major cause of her lower extremity edema, at least as much so as her venous insufficiency. Recommended that she increase her dose of furosemide to 120 mg daily (and also increase her potassium chloride to 30 mEq daily) when the edema becomes severe enough to cause skin problems or discomfort.

## 2013-07-07 ENCOUNTER — Other Ambulatory Visit: Payer: Self-pay | Admitting: Cardiovascular Disease

## 2013-07-10 ENCOUNTER — Telehealth: Payer: Self-pay | Admitting: Cardiovascular Disease

## 2013-07-10 NOTE — Telephone Encounter (Signed)
Returned call to [facility] outpatient pharmacy.Marland Kitchen Updated instructions for furosemide 40mg  tablets should read "Take Lasix 80mg  daily. You can increase to 120mg  daily (extra 40mg  tablet) as needed"

## 2013-07-10 NOTE — Telephone Encounter (Signed)
Please call concerning directions on her generic Lasix.e

## 2013-10-23 IMAGING — CR DG NECK SOFT TISSUE
1 series · 1 of 1 positions shown · non-contrast
Comparison: None.

CLINICAL DATA: Swelling of the right side of the neck.  Bruising.
Recent parathyroid adenoma resection.

NECK SOFT TISSUES - 1+ VIEW

[w soft tissue neck]
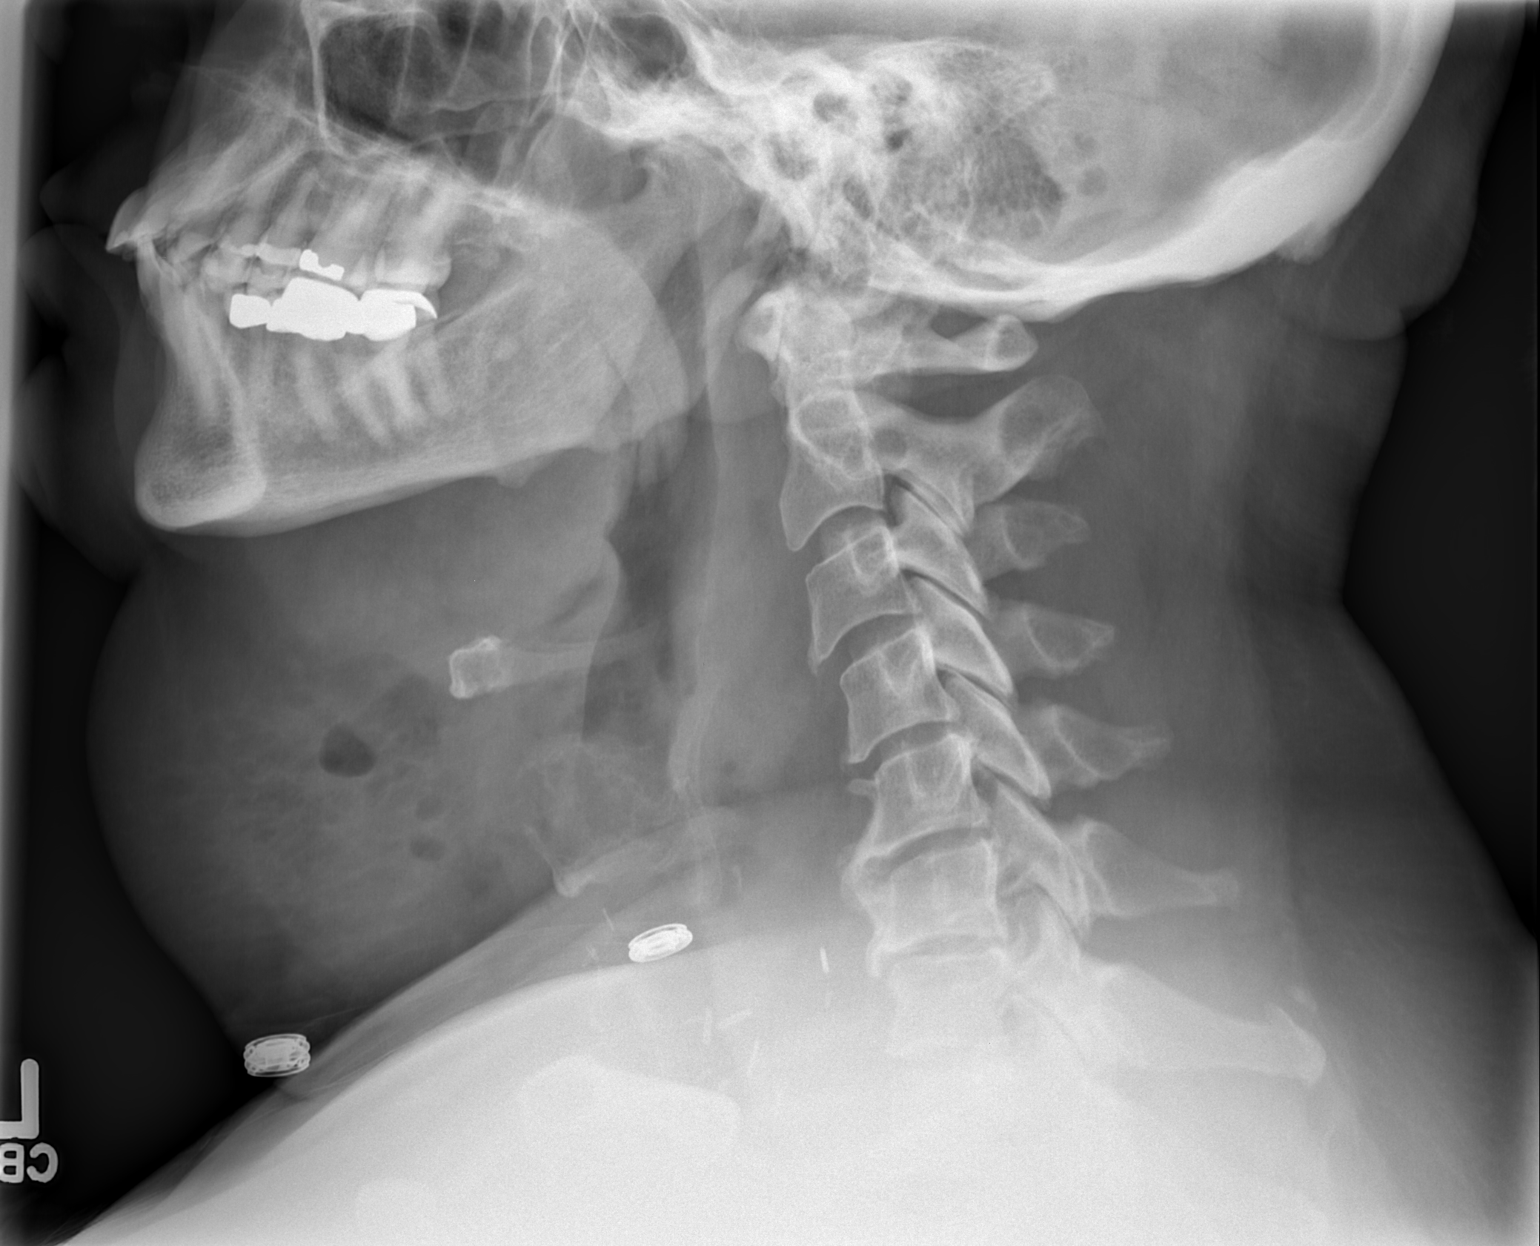

[1 of 1 positions shown; findings below may reference images not displayed]

FINDINGS: There is prevertebral soft tissue swelling consistent
with edema or hemorrhage.  There is some air and edema in the
subcutaneous fat below the mandible.  There is no compression of
the upper airway.  Multiple surgical clips are seen at the site of
the previous parathyroid adenoma resection.
IMPRESSION: There is abnormal prevertebral soft tissue swelling consistent with
either edema or hemorrhage in the prevertebral soft tissues.
Trachea is deviated slightly anteriorly.

## 2013-10-25 ENCOUNTER — Other Ambulatory Visit (HOSPITAL_COMMUNITY): Payer: Self-pay | Admitting: Endocrinology

## 2013-10-25 DIAGNOSIS — Z1231 Encounter for screening mammogram for malignant neoplasm of breast: Secondary | ICD-10-CM

## 2013-11-02 ENCOUNTER — Emergency Department (HOSPITAL_BASED_OUTPATIENT_CLINIC_OR_DEPARTMENT_OTHER)
Admission: EM | Admit: 2013-11-02 | Discharge: 2013-11-02 | Disposition: A | Discharge status: Home / Self Care | Payer: No Typology Code available for payment source | Attending: Emergency Medicine | Admitting: Emergency Medicine

## 2013-11-02 ENCOUNTER — Emergency Department (HOSPITAL_BASED_OUTPATIENT_CLINIC_OR_DEPARTMENT_OTHER): Payer: No Typology Code available for payment source

## 2013-11-02 ENCOUNTER — Encounter (HOSPITAL_BASED_OUTPATIENT_CLINIC_OR_DEPARTMENT_OTHER): Payer: Self-pay | Admitting: Emergency Medicine

## 2013-11-02 DIAGNOSIS — G608 Other hereditary and idiopathic neuropathies: Secondary | ICD-10-CM | POA: Diagnosis not present

## 2013-11-02 DIAGNOSIS — M79641 Pain in right hand: Secondary | ICD-10-CM

## 2013-11-02 DIAGNOSIS — Z7982 Long term (current) use of aspirin: Secondary | ICD-10-CM | POA: Diagnosis not present

## 2013-11-02 DIAGNOSIS — F3289 Other specified depressive episodes: Secondary | ICD-10-CM | POA: Diagnosis not present

## 2013-11-02 DIAGNOSIS — S6990XA Unspecified injury of unspecified wrist, hand and finger(s), initial encounter: Secondary | ICD-10-CM | POA: Diagnosis not present

## 2013-11-02 DIAGNOSIS — I10 Essential (primary) hypertension: Secondary | ICD-10-CM | POA: Diagnosis not present

## 2013-11-02 DIAGNOSIS — Z8709 Personal history of other diseases of the respiratory system: Secondary | ICD-10-CM | POA: Insufficient documentation

## 2013-11-02 DIAGNOSIS — Y9389 Activity, other specified: Secondary | ICD-10-CM | POA: Diagnosis not present

## 2013-11-02 DIAGNOSIS — S8990XA Unspecified injury of unspecified lower leg, initial encounter: Secondary | ICD-10-CM | POA: Insufficient documentation

## 2013-11-02 DIAGNOSIS — Z8619 Personal history of other infectious and parasitic diseases: Secondary | ICD-10-CM | POA: Diagnosis not present

## 2013-11-02 DIAGNOSIS — Y92410 Unspecified street and highway as the place of occurrence of the external cause: Secondary | ICD-10-CM | POA: Insufficient documentation

## 2013-11-02 DIAGNOSIS — M25561 Pain in right knee: Secondary | ICD-10-CM

## 2013-11-02 DIAGNOSIS — Z8701 Personal history of pneumonia (recurrent): Secondary | ICD-10-CM | POA: Diagnosis not present

## 2013-11-02 DIAGNOSIS — S99919A Unspecified injury of unspecified ankle, initial encounter: Secondary | ICD-10-CM | POA: Diagnosis not present

## 2013-11-02 DIAGNOSIS — Z9889 Other specified postprocedural states: Secondary | ICD-10-CM | POA: Insufficient documentation

## 2013-11-02 DIAGNOSIS — Z8719 Personal history of other diseases of the digestive system: Secondary | ICD-10-CM | POA: Insufficient documentation

## 2013-11-02 DIAGNOSIS — I519 Heart disease, unspecified: Secondary | ICD-10-CM | POA: Diagnosis not present

## 2013-11-02 DIAGNOSIS — I872 Venous insufficiency (chronic) (peripheral): Secondary | ICD-10-CM | POA: Insufficient documentation

## 2013-11-02 DIAGNOSIS — F329 Major depressive disorder, single episode, unspecified: Secondary | ICD-10-CM | POA: Insufficient documentation

## 2013-11-02 DIAGNOSIS — Z79899 Other long term (current) drug therapy: Secondary | ICD-10-CM | POA: Insufficient documentation

## 2013-11-02 DIAGNOSIS — E039 Hypothyroidism, unspecified: Secondary | ICD-10-CM | POA: Diagnosis not present

## 2013-11-02 DIAGNOSIS — T2200XA Burn of unspecified degree of shoulder and upper limb, except wrist and hand, unspecified site, initial encounter: Secondary | ICD-10-CM | POA: Insufficient documentation

## 2013-11-02 DIAGNOSIS — S99929A Unspecified injury of unspecified foot, initial encounter: Secondary | ICD-10-CM | POA: Diagnosis present

## 2013-11-02 DIAGNOSIS — IMO0002 Reserved for concepts with insufficient information to code with codable children: Secondary | ICD-10-CM | POA: Insufficient documentation

## 2013-11-02 MED ORDER — ACETAMINOPHEN 500 MG PO TABS
1000.0000 mg | ORAL_TABLET | Freq: Once | ORAL | Status: AC
  Administered 2013-11-02: 1000 mg via ORAL
  Filled 2013-11-02: qty 2

## 2013-11-02 NOTE — Discharge Instructions (Signed)
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
## 2013-11-07 ENCOUNTER — Ambulatory Visit (HOSPITAL_COMMUNITY): Payer: Medicare Other

## 2013-12-07 ENCOUNTER — Telehealth: Payer: Self-pay | Admitting: Cardiovascular Disease

## 2013-12-07 NOTE — Telephone Encounter (Signed)
Pt was wanting to know if she had to come in a be seen by Dr. Claiborne Billings in order for her CPAP machine to be serviced. If not not she would like an order to be sent in so this can be done. Please call  Thanks

## 2013-12-07 NOTE — Telephone Encounter (Signed)
CPAP through [facility] told patient she needs a note from Dr. Claiborne Billings authorizing service for another year.  Deferred to [facility].

## 2013-12-08 ENCOUNTER — Ambulatory Visit (HOSPITAL_COMMUNITY)
Admission: RE | Admit: 2013-12-08 | Discharge: 2013-12-08 | Disposition: A | Discharge status: Home / Self Care | Payer: Medicare Other | Source: Ambulatory Visit | Attending: Endocrinology | Admitting: Endocrinology

## 2013-12-08 DIAGNOSIS — Z1231 Encounter for screening mammogram for malignant neoplasm of breast: Secondary | ICD-10-CM | POA: Diagnosis present

## 2013-12-08 NOTE — Telephone Encounter (Signed)
Close encounter 

## 2013-12-22 ENCOUNTER — Telehealth: Payer: Self-pay | Admitting: *Deleted

## 2013-12-22 NOTE — Telephone Encounter (Signed)
Spoke with patient informing her that i have spoken with Charlene @ [facility] patient. And she informed me that she can get supplies after October the 15th.  States the patient needs a FTF with the MD. Advised her that she had appointment with Dr. Sallyanne Kuster last March. .I will fax over that note to see if it will suffice for what they are needing.  March 30th office visit with Dr. Sallyanne Kuster faxed to [facility] patient @[phone]. I also questioned when she had her last download. Patient informs me that it has been greater than 1 year. Informed her that it is time to get one. Patient voiced her understanding and states that she will mail in the chip from the machine  to them. Recommended that she keep her 01/23/14 appointment with Dr. Claiborne Billings unless otherwise advised.

## 2014-01-23 ENCOUNTER — Encounter: Payer: Self-pay | Admitting: Cardiovascular Disease

## 2014-01-23 ENCOUNTER — Telehealth: Payer: Self-pay | Admitting: *Deleted

## 2014-01-23 ENCOUNTER — Ambulatory Visit (INDEPENDENT_AMBULATORY_CARE_PROVIDER_SITE_OTHER): Payer: Medicare Other | Admitting: Cardiovascular Disease

## 2014-01-23 VITALS — BP 118/73 | HR 73 | Ht 66.0 in | Wt 287.0 lb

## 2014-01-23 DIAGNOSIS — I10 Essential (primary) hypertension: Secondary | ICD-10-CM

## 2014-01-23 DIAGNOSIS — Z9989 Dependence on other enabling machines and devices: Principal | ICD-10-CM

## 2014-01-23 DIAGNOSIS — G4733 Obstructive sleep apnea (adult) (pediatric): Secondary | ICD-10-CM

## 2014-01-23 DIAGNOSIS — R600 Localized edema: Secondary | ICD-10-CM

## 2014-01-23 DIAGNOSIS — I872 Venous insufficiency (chronic) (peripheral): Secondary | ICD-10-CM

## 2014-01-23 NOTE — Patient Instructions (Signed)
Your physician wants you to follow-up in: 1 year or sooner if needed for Your sleep apnea You will receive a reminder letter in the mail two months in advance. If you don't receive a letter, please call our office to schedule the follow-up appointment.

## 2014-01-23 NOTE — Telephone Encounter (Signed)
Faxed prescription for CPAP download and order to continue following CPAP and supplies to [facility] @ [phone].

## 2014-01-25 DIAGNOSIS — R600 Localized edema: Secondary | ICD-10-CM | POA: Insufficient documentation

## 2014-01-25 NOTE — Progress Notes (Signed)
Patient ID: Amber Cook, female   DOB: [birth year], 68 y.o.   MRN: [id number]    HPI: Amber Cook, is a 68 y.o. female who presents to the office today for a one-year follow-up evaluation of her sleep apnea.  Ms. Amber Cook has a history of ASD repair by Dr. Servando Snare, obesity, hyperlipidemia, venous insufficiency, and is status post ablation of right greater saphenous vein graft. She has significant obstructive sleep apnea on CPAP therapy. She is status post parathyroidectomy for primary hyperparathyroidism with resolution of hypercalcemia. She had undergone a sleep evaluation prior to her parathyroid surgery which confirmed moderate sleep apnea overall with an HIV 17.2 but severe sleep apnea during REM sleep with an AHI of 82.3. Following her most recent sleep study, I did recommend changing her CPAP therapy to CPAP although unit with a minimum pressure of 1012 maximum pressure of 20 cm water pressure with heated humidification to  A. CPAP download from 06/10/2012 through 07/12/2012 when she was on a set pressure of 9 cm revealed an AHI of 3 since that time, she has been set to an CPAPauto unit due to her positional component of her and sensation of inadequate pressures.   I last saw her one year ago for a sleep clinic evaluation at that time, her Epworth Sleepiness Scale score endorsed at 9.  That time she also had  leak due to her mask seal.  Over the past year, she admits to CPAP with 100% compliance.  Her MDE: And he is [facility].  He typically goes to bed between 11 and 1 AM, except at 10 AM her sleep duration of approximately 9 hours.  If she is on her back.  She still notes some mild snoring but denies any snoring when she sleeps on her side.  She has not had a recent download obtained by [facility].  She is induced need for new supplies and presents for evaluation.  She denies chest pain.  She does admit to bilateral lower extremity edema contributed by her venous  insufficiency, but this significantly improves after sleeping through the night.  New Epworth Sleepiness Scale score was calculated today and this endorsed at 11 , as is detailed below:   Epworth Sleepiness Scale: Situation   Chance of Dozing/Sleeping (0 = never , 1 = slight chance , 2 = moderate chance , 3 = high chance )   sitting and reading 1   watching TV 3   sitting inactive in a public place 2   being a passenger in a motor vehicle for an hour or more 1   lying down in the afternoon 3   sitting and talking to someone 0   sitting quietly after lunch (no alcohol) 1   while stopped for a few minutes in traffic as the driver 0   Total Score  11    Past Medical History  Diagnosis Date  . Diastolic dysfunction 8/67/61    grade 1  . Hyperlipidemia     takes Welchol bid   . Hypertension     takes Metoprolol nightly   . Pneumonia     hx of->a yr ago  . History of bronchitis     a month ago  . Sleep apnea     supposed to use CPAP;repeat study tonight @ [place]  . Peripheral neuropathy   . Hypercalcemia   . Elevated parathyroid hormone   . Arthritis   . Vaginal yeast infection   . Diabetes  mellitus without complication     borderline  . GERD (gastroesophageal reflux disease)     but doesn't require meds  . Colitis   . Internal hemorrhoids   . Urinary frequency     takes Lasix daily  . Hypothyroidism     takes Synthroid daily  . Depression     takes Lexapro daily  . ASD (atrial septal defect), ostium secundum     surgical repair 7/08  . Venous insufficiency     SVG ablation    Past Surgical History  Procedure Laterality Date  . Tonsillectomy and adenoidectomy      and adenoids at age 13  . Atral septal defect repair  July 2009    after failed ASD closure  . Right vein ligation  2009  . Cervical biopsy    . Left breast biopsy    . Colonoscopy    . Cardiac catheterization  2008  . 2d echo with bubble study  03/31/12  . Parathyroid exploration  04/05/2012      Procedure: PARATHYROID EXPLORATION;  Surgeon: Pedro Earls, MD;  Location: [place];  Service: General;  Laterality: N/A;  Exploration of parathryroid and removal of left and right interior parathyroid  . Parathyroidectomy      Allergies  Allergen Reactions  . Erythromycin Nausea And Vomiting    Stomach pain  . Lidocaine   . Other     pomagranite--tongue itching  Nuts-tongue with a funny feeling  . Prilosec [Omeprazole]   . Statins Other (See Comments)    Muscle cramps.   . Vicodin [Hydrocodone-Acetaminophen]     Current Outpatient Prescriptions  Medication Sig Dispense Refill  . aspirin 325 MG tablet Take 325 mg by mouth daily.    Marland Kitchen escitalopram (LEXAPRO) 20 MG tablet Take 1 tablet by mouth daily.  5  . furosemide (LASIX) 80 MG tablet Take 1 tablet by mouth daily.  98  . levothyroxine (SYNTHROID, LEVOTHROID) 125 MCG tablet Take 125 mcg by mouth daily.    . metoprolol succinate (TOPROL-XL) 25 MG 24 hr tablet Take 1 tablet (25 mg total) by mouth daily. 90 tablet 3  . Niacin CR 1000 MG TBCR Take 1 tablet (1,000 mg total) by mouth daily. 90 tablet 3  . potassium chloride (KLOR-CON M10) 10 MEQ tablet Takes 10-20 meq daily unless she takes extra furosemide then she takes 30 meq. 270 tablet 3  . traMADol (ULTRAM) 50 MG tablet Take 50 mg by mouth every 6 (six) hours as needed. pain    . colesevelam (WELCHOL) 625 MG tablet Take 3 tablets (1,875 mg total) by mouth 2 (two) times daily with a meal. 540 tablet 3  . [DISCONTINUED] niacin (NIASPAN) 1000 MG CR tablet      No current facility-administered medications for this visit.    Socially she is single has no children. She does not exercise. No tobacco or alcohol use.  ROS General: Negative; No fevers, chills, or night sweats;  HEENT: Negative; No changes in vision or hearing, sinus congestion, difficulty swallowing Pulmonary: Negative; No cough, wheezing, shortness of breath, hemoptysis Cardiovascular: Negative; No chest pain,  presyncope, syncope, palpitations Positive for lower extremity edema and venous insufficiency GI: +30; No nausea, vomiting, diarrhea, or abdominal pain GU: Negative; No dysuria, hematuria, or difficulty voiding Musculoskeletal: Negative; no myalgias, joint pain, or weakness Hematologic/Oncology: Negative; no easy bruising, bleeding Endocrine: Negative; no heat/cold intolerance; no diabetes Neuro: Negative; no changes in balance, headaches Skin: Negative; No rashes or skin lesions Psychiatric:  Negative; No behavioral problems, depression Sleep: positivefor obstructive sleep apnea with residual snoring with supine posture; mild daytime sleepiness, hypersomnolence: nobruxism, restless legs, hypnogognic hallucinations, no cataplexy Other comprehensive 14 point system review is negative.   PE BP 118/73 mmHg  Pulse 73  Ht 5\' 6"  (1.676 m)  Wt 287 lb (130.182 kg)  BMI 46.35 kg/m2  General: Alert, oriented, no distress.  Skin: normal turgor, no rashes HEENT: Normocephalic, atraumatic. Pupils round and reactive; sclera anicteric;  Nose without nasal septal hypertrophy Mouth/Parynx benign; Mallinpatti scale 3 Neck: No JVD, no carotid bruits with normal carotid upstroke Chest wall: Nontender to palpation Lungs: clear to ausculatation and percussion; no wheezing or rales Heart: RRR, s1 s2 normal .  No S3 or S4 gallop.  faint systolic murmur.  No rubs thrills or heaves Abdomen: soft, nontender; no hepatosplenomehaly, BS+; abdominal aorta nontender and not dilated by palpation. Back: No CVA tenderness Pulses 2+ Extremities:1-2+ lower extremity edema, no clubbinbg cyanosis, Homan's sign negative  Neurologic: grossly nonfocal   Interrogation of her CPAP unit from 10/19/2012 through 11/17/2012 last year: CPAP Auto at 10-20 cm water pressure with CPR 3. Her 95% pressure was 13.9. AHI 3.0 per hour, her leak was elevated at 24. Usage was 76% greater than 4 hours.   LABS:  BMET    Component  Value Date/Time   NA 139 04/21/2012 1234   K 4.3 04/21/2012 1234   CL 104 04/21/2012 1234   CO2 28 04/21/2012 1234   GLUCOSE 115* 04/21/2012 1234   BUN 18 04/21/2012 1234   CREATININE 1.14* 04/21/2012 1234   CREATININE 0.86 04/07/2012 0650   CALCIUM 9.1 04/21/2012 1234   CALCIUM 10.5 01/06/2012 1630   GFRNONAA 68* 04/07/2012 0650   GFRAA 79* 04/07/2012 0650     Hepatic Function Panel     Component Value Date/Time   PROT 6.7 04/21/2012 1234   ALBUMIN 3.7 04/21/2012 1234   AST 20 04/21/2012 1234   ALT 25 04/21/2012 1234   ALKPHOS 102 04/21/2012 1234   BILITOT 0.4 04/21/2012 1234     CBC    Component Value Date/Time   WBC 6.0 04/07/2012 0650   RBC 4.07 04/07/2012 0650   HGB 12.9 04/07/2012 0650   HCT 39.3 04/07/2012 0650   PLT 204 04/07/2012 0650   MCV 96.6 04/07/2012 0650   MCH 31.7 04/07/2012 0650   MCHC 32.8 04/07/2012 0650   RDW 13.1 04/07/2012 0650     BNP No results found for: PROBNP  Lipid Panel     Component Value Date/Time   CHOL 279* 04/01/2012 1436   CHOL 282* 04/01/2012 1436   TRIG 256* 04/01/2012 1436   TRIG 267* 04/01/2012 1436   HDL 44 04/01/2012 1436   HDL 45 04/01/2012 1436   CHOLHDL 6.3 04/01/2012 1436   CHOLHDL 6.3 04/01/2012 1436   VLDL 51* 04/01/2012 1436   VLDL 53* 04/01/2012 1436   LDLCALC 184* 04/01/2012 1436   LDLCALC 184* 04/01/2012 1436     RADIOLOGY: No results found.    ASSESSMENT AND PLAN: Ms. Geroge Baseman is status post atrial septal defect repair, she has a history of venous insufficiency, status post venous ablation for right greater saphenous vein graft. She continues to have leg swelling and have suggested that she can take up to 80 mg Lasix daily. With reference to her sleep apnea, he continues to be compliant with her CPAP usage at 100%.  She is sleeping adequate duration 49 hours.  She has a CPAP auto unit,  which should increase pressures if necessary in the supine position.  I've asked that she had a download be  obtained from [facility] to see if adjustments can be made to her current settings.  I have written a prescription for her to have continued supplies since she continues to meet Medicare compliance standards. We discussed the importance of weight loss.  Her body mass index is 48.65, placing her in the morbidly obese category.  I will see her in one year for  reevaluation or sooner if problems arise.  Time spent: 25 minutes   Troy Sine, MD, [facility]  01/25/2014 6:40 PM

## 2014-08-29 ENCOUNTER — Encounter: Payer: Self-pay | Admitting: *Deleted

## 2014-12-04 ENCOUNTER — Other Ambulatory Visit: Payer: Self-pay | Admitting: Cardiovascular Disease

## 2015-01-24 ENCOUNTER — Telehealth: Payer: Self-pay | Admitting: Cardiovascular Disease

## 2015-01-24 NOTE — Telephone Encounter (Signed)
I did not call this lady. Please call to assist. Thanks.

## 2015-01-24 NOTE — Telephone Encounter (Signed)
Returned call, unable to get in touch with caller.  I do not see documentation to be able to determine the nature of this phone call.

## 2015-01-24 NOTE — Telephone Encounter (Signed)
Amber Cook is returning Wanda's call in regards to the pt. Please f/u with her  She says she will return from lunch around 2pm  Thanks

## 2015-01-25 ENCOUNTER — Ambulatory Visit (INDEPENDENT_AMBULATORY_CARE_PROVIDER_SITE_OTHER): Payer: Medicare Other | Admitting: Cardiovascular Disease

## 2015-01-25 VITALS — BP 134/86 | HR 71 | Ht 66.25 in | Wt 299.3 lb

## 2015-01-25 DIAGNOSIS — Z01810 Encounter for preprocedural cardiovascular examination: Secondary | ICD-10-CM | POA: Diagnosis not present

## 2015-01-25 DIAGNOSIS — Z79899 Other long term (current) drug therapy: Secondary | ICD-10-CM

## 2015-01-25 DIAGNOSIS — I10 Essential (primary) hypertension: Secondary | ICD-10-CM | POA: Diagnosis not present

## 2015-01-25 DIAGNOSIS — R609 Edema, unspecified: Secondary | ICD-10-CM | POA: Diagnosis not present

## 2015-01-25 MED ORDER — METOLAZONE 2.5 MG PO TABS: 2.5000 mg | ORAL_TABLET | Freq: Every day | ORAL | Status: DC

## 2015-01-25 NOTE — Patient Instructions (Signed)
Your physician has requested that you have an echocardiogram. Echocardiography is a painless test that uses sound waves to create images of your heart. It provides your doctor with information about the size and shape of your heart and how well your heart's chambers and valves are working. This procedure takes approximately one hour. There are no restrictions for this procedure.  Your physician recommends that you return for lab work in: 2 weeks.   Your physician has recommended you make the following change in your medication: start new metolazone prescription as directed on the bottle. This has been sent to your pharmacy.  Your physician wants you to follow-up in: 6 months or sooner if needed. You will receive a reminder letter in the mail two months in advance. If you don't receive a letter, please call our office to schedule the follow-up appointment.   How to Use Compression Stockings Compression stockings are elastic socks that squeeze the legs. They help to increase blood flow to the legs, decrease swelling in the legs, and reduce the chance of developing blood clots in the lower legs. Compression stockings are often used by people who:  Are recovering from surgery.  Have poor circulation in their legs.  Are prone to getting blood clots in their legs.  Have varicose veins.  Sit or stay in bed for long periods of time. HOW TO USE COMPRESSION STOCKINGS Before you put on your compression stockings:  Make sure that they are the correct size. If you do not know your size, ask your health care provider.  Make sure that they are clean, dry, and in good condition.  Check them for rips and tears. Do not put them on if they are ripped or torn. Put your stockings on first thing in the morning, before you get out of bed. Keep them on for as long as your health care provider advises. When you are wearing your stockings:  Keep them as smooth as possible. Do not allow them to bunch up. It is  especially important to prevent the stockings from bunching up around your toes or behind your knees.  Do not roll the stockings downward and leave them rolled down. This can decrease blood flow to your leg.  Change them right away if they become wet or dirty. When you take off your stockings, inspect your legs and feet. Anything that does not seem normal may require medical attention. Look for:  Open sores.  Red spots.  Swelling. INFORMATION AND TIPS  Do not stop wearing your compression stockings without talking to your health care provider first.  Wash your stockings everyday with mild detergent in cold or warm water. Do not use bleach. Air-dry your stockings or dry them in a clothes dryer on low heat.  Replace your stockings every 3-6 months.  If skin moisturizing is part of your treatment plan, apply lotion or cream at night so that your skin will be dry when you put on the stockings in the morning. It is harder to put the stockings on when you have lotion on your legs or feet. SEEK MEDICAL CARE IF: Remove your stockings and seek medical care if:  You have a feeling of pins and needles in your feet or legs.  You have any new changes in your skin.  You have skin lesions that are getting worse.  You have swelling or pain that is getting worse. SEEK IMMEDIATE MEDICAL CARE IF:  You have numbness or tingling in your lower legs that does  not get better immediately after you take the stockings off.  Your toes or feet become cold and blue.  You develop open sores or red spots on your legs that do not go away.  You see or feel a warm spot on your leg.  You have new swelling or soreness in your leg.  You are short of breath or you have chest pain for no reason.  You have a rapid or irregular heartbeat.  You feel light-headed or dizzy.   This information is not intended to replace advice given to you by your health care provider. Make sure you discuss any questions you have  with your health care provider.   Document Released: 01/04/2009 Document Revised: 07/24/2014 Document Reviewed: 02/14/2014 Elsevier Interactive Patient Education [employer].

## 2015-01-27 ENCOUNTER — Encounter: Payer: Self-pay | Admitting: Cardiovascular Disease

## 2015-01-27 NOTE — Progress Notes (Signed)
Patient ID: Amber Amber [name], female   DOB: 04-21-1945, 70 y.o.   MRN: [id number]    HPI: Amber Amber [name], is a 69 y.o. female who presents to the office today for a one-year follow-up evaluation.  Ms. Amber Amber [name] has a history of ASD repair by Dr. Servando Amber [name], obesity, hyperlipidemia, venous insufficiency, and is status post ablation of right greater saphenous vein graft. She has significant obstructive sleep apnea on CPAP therapy. She is status post parathyroidectomy for primary hyperparathyroidism with resolution of hypercalcemia. She had undergone a sleep evaluation prior to her parathyroid surgery which confirmed moderate sleep apnea overall with Amber [name] HIV 17.2 but severe sleep apnea during REM sleep with Amber [name] AHI of 82.3. Following her most recent sleep study, I did recommend changing her CPAP therapy to CPAP Auto unit with a minimum pressure of 10 and maximum pressure of 20 cm water pressure with heated humidification.  A. CPAP download from 06/10/2012 through 07/12/2012 when she was on a set pressure of 9 cm revealed Amber [name] AHI of 3 since that time, she has been set to Amber [name] CPAPauto unit due to her positional component of her and sensation of inadequate pressures.   Over the past year, she admits to CPAP with 100% compliance.  Her DME is [facility].  She typically goes to bed between 11 and 1 AM, except at 10 AM her sleep duration of approximately 9 hours.  If she is on her back.  She still notes some mild snoring but denies any snoring when she sleeps on her side.    She denies chest pain.  She has experienced progressive tense bilateral lower extremity edema contributed by her venous insufficiency which previously use to improve after she slept through the night.  Additional problems include hypothyroidism for which he is on Synthroid.  She has a history of significant hyperlipidemia and admits to being statin intolerant.  She states that she has taken all the statins and could not tolerate them. She  recently had laboratory done by Dr. Wilson Amber [name].  Lipids were notable for cholesterol that had risen to 300, and LDL cholesterol of 215, triglycerides 161, VLDL 32, and HDL 53.  TSH was 1.35.  Renal function was stable with a BUN 15, creatinine 0.85.  She tells me that she may need to undergo a D&C by Dr. Sharlyn Amber [name].  She presents for evaluation.   Past Medical History  Diagnosis Date  . Diastolic dysfunction 0/76/22    grade 1  . Hyperlipidemia     takes Welchol bid   . Hypertension     takes Metoprolol nightly   . Pneumonia     hx of->a yr ago  . History of bronchitis     a month ago  . Sleep apnea     supposed to use CPAP;repeat study tonight @ [facility]  . Peripheral neuropathy ([place])   . Hypercalcemia   . Elevated parathyroid hormone   . Arthritis   . Vaginal yeast infection   . Diabetes mellitus without complication (HCC)     borderline  . GERD (gastroesophageal reflux disease)     but doesn't require meds  . Colitis   . Internal hemorrhoids   . Urinary frequency     takes Lasix daily  . Hypothyroidism     takes Synthroid daily  . Depression     takes Lexapro daily  . ASD (atrial septal defect), ostium secundum     surgical repair 7/08  . Venous insufficiency  SVG ablation    Past Surgical History  Procedure Laterality Date  . Tonsillectomy and adenoidectomy      and adenoids at age 25  . Atral septal defect repair  July 2009    after failed ASD closure  . Right vein ligation  2009  . Cervical biopsy    . Left breast biopsy    . Colonoscopy    . Cardiac catheterization  07/21/2006    Right & Left: normal coronary arteries, mod. pulmonary hypertension w/evidence of left to right shunting.  . 2d echo with bubble study  03/31/12  . Parathyroid exploration  04/05/2012    Procedure: PARATHYROID EXPLORATION;  Surgeon: Amber Earls, MD;  Location: [facility];  Service: General;  Laterality: N/A;  Exploration of parathryroid and removal of left and right interior  parathyroid  . Parathyroidectomy    . Transesophageal echocardiogram  08/13/2006    unsuccessful attempt at ASD closure  . Nm myocar perf wall motion  07/15/2006    high risk scan -    Allergies  Allergen Reactions  . Erythromycin Nausea And Vomiting    Stomach pain  . Lidocaine   . Other     pomagranite--tongue itching  Nuts-tongue with a funny feeling  . Prilosec [Omeprazole]   . Statins Other (See Comments)    Muscle cramps.   . Vicodin [Hydrocodone-Acetaminophen]     Current Outpatient Prescriptions  Medication Sig Dispense Refill  . aspirin 325 MG tablet Take 325 mg by mouth daily.    Marland Kitchen escitalopram (LEXAPRO) 20 MG tablet Take 1 tablet by mouth daily.  5  . furosemide (LASIX) 80 MG tablet Take 1 tablet by mouth daily.  98  . latanoprost (XALATAN) 0.005 % ophthalmic solution Place 1 drop into both eyes daily.  11  . metoprolol succinate (TOPROL-XL) 25 MG 24 hr tablet Take 1 tablet (25 mg total) by mouth daily. 90 tablet 3  . potassium chloride (K-DUR,KLOR-CON) 10 MEQ tablet TAKE 1 TO 3 TABLETS BY MOUTH DAILY AS DIRECTED 90 tablet 0  . spironolactone (ALDACTONE) 25 MG tablet Take 2 tablets by mouth daily.  99  . SYNTHROID 137 MCG tablet Take 1 tablet by mouth daily.  99  . traMADol (ULTRAM) 50 MG tablet Take 50 mg by mouth every 6 (six) hours as needed. pain    . metolazone (ZAROXOLYN) 2.5 MG tablet Take 1 tablet (2.5 mg total) by mouth daily. 30 minutes before your furosemide dose. For 2 days, then take every 2-3 days 30 tablet 1  . [DISCONTINUED] niacin (NIASPAN) 1000 MG CR tablet      No current facility-administered medications for this visit.    Socially she is single has no children. She does not exercise. No tobacco or alcohol use.  ROS General: Negative; No fevers, chills, or night sweats;  HEENT: Negative; No changes in vision or hearing, sinus congestion, difficulty swallowing Pulmonary: Negative; No cough, wheezing, shortness of breath,  hemoptysis Cardiovascular: Negative; No chest pain, presyncope, syncope, palpitations Positive for lower extremity edema and venous insufficiency GI: no nausea, vomiting or diarrhea GU: Negative; No dysuria, hematuria, or difficulty voiding Musculoskeletal: Negative; no myalgias, joint pain, or weakness Hematologic/Oncology: Negative; no easy bruising, bleeding Endocrine: positive for hypothyroidism Neuro: Negative; no changes in balance, headaches Skin: Negative; No rashes or skin lesions Psychiatric: positive for depression Sleep: positive for obstructive sleep apnea with residual snoring with supine posture; mild daytime sleepiness, hypersomnolence: nobruxism, restless legs, hypnogognic hallucinations, no cataplexy Other comprehensive 14 point  system review is negative.   PE BP 134/86 mmHg  Pulse 71  Ht 5' 6.25" (1.683 m)  Wt 299 lb 4.8 oz (135.762 kg)  BMI 47.93 kg/m2   Wt Readings from Last 3 Encounters:  01/25/15 299 lb 4.8 oz (135.762 kg)  01/23/14 287 lb (130.182 kg)  11/02/13 296 lb 6 oz (134.435 kg)   General: Alert, oriented, no distress.  Skin: normal turgor, no rashes HEENT: Normocephalic, atraumatic. Pupils round and reactive; sclera anicteric;  Nose without nasal septal hypertrophy Mouth/Parynx benign; Mallinpatti scale 3 Neck: No JVD, no carotid bruits with normal carotid upstroke Chest wall: Nontender to palpation Lungs: clear to ausculatation and percussion; no wheezing or rales Heart: RRR, s1 s2 normal . Faint 1/6 systolic murmur. No S3 or S4 gallop.  No rubs thrills or heaves Abdomen: soft, nontender; no hepatosplenomehaly, BS+; abdominal aorta nontender and not dilated by palpation. Back: No CVA tenderness Pulses 2+ Extremities: 3+ tense lower extremity edema, no clubbinbg cyanosis, Homan's sign negative  Neurologic: grossly nonfocal  ECG (independently read by me): normal sinus rhythm at 71 bpm.  Q wave in lead 3.  Nonspecific ST changes.   Prior  Interrogation of her CPAP unit from 10/19/2012 through 11/17/2012 last year: CPAP Auto at 10-20 cm water pressure with CPR 3. Her 95% pressure was 13.9. AHI 3.0 per hour, her leak was elevated at 24. Usage was 76% greater than 4 hours.   LABS: I personally reviewed the laboratory done by Dr. Anda Kraft at [facility] from 10/29/2014.  BMET    Component Value Date/Time   NA 139 04/21/2012 1234   K 4.3 04/21/2012 1234   CL 104 04/21/2012 1234   CO2 28 04/21/2012 1234   GLUCOSE 115* 04/21/2012 1234   BUN 18 04/21/2012 1234   CREATININE 1.14* 04/21/2012 1234   CREATININE 0.86 04/07/2012 0650   CALCIUM 9.1 04/21/2012 1234   CALCIUM 10.5 01/06/2012 1630   GFRNONAA 68* 04/07/2012 0650   GFRAA 79* 04/07/2012 0650     Hepatic Function Panel     Component Value Date/Time   PROT 6.7 04/21/2012 1234   ALBUMIN 3.7 04/21/2012 1234   AST 20 04/21/2012 1234   ALT 25 04/21/2012 1234   ALKPHOS 102 04/21/2012 1234   BILITOT 0.4 04/21/2012 1234     CBC    Component Value Date/Time   WBC 6.0 04/07/2012 0650   RBC 4.07 04/07/2012 0650   HGB 12.9 04/07/2012 0650   HCT 39.3 04/07/2012 0650   PLT 204 04/07/2012 0650   MCV 96.6 04/07/2012 0650   MCH 31.7 04/07/2012 0650   MCHC 32.8 04/07/2012 0650   RDW 13.1 04/07/2012 0650     BNP No results found for: PROBNP  Lipid Panel     Component Value Date/Time   CHOL 279* 04/01/2012 1436   CHOL 282* 04/01/2012 1436   TRIG 256* 04/01/2012 1436   TRIG 267* 04/01/2012 1436   HDL 44 04/01/2012 1436   HDL 45 04/01/2012 1436   CHOLHDL 6.3 04/01/2012 1436   CHOLHDL 6.3 04/01/2012 1436   VLDL 51* 04/01/2012 1436   VLDL 53* 04/01/2012 1436   LDLCALC 184* 04/01/2012 1436   LDLCALC 184* 04/01/2012 1436     RADIOLOGY: No results found.    ASSESSMENT AND PLAN: Amber Amber [name] is a 41 -year-old white female who is status post atrial septal defect repair and has a history of venous insufficiency, status post venous  ablation for right greater saphenous vein graft. She  continues to have progressive leg swelling which now is tense and at least 3+ edema bilaterally.she has been taking Lasix 80 mg daily without significant benefit.  In addition to Aldactone 25 mg.  I have recommended metolazone 2.5 mg to take 30 mg prior to her Lasix dose for the next 2 days and then she will take a dose every 2-3 days depending upon her leg edema.  She will have a Bmet in 2 weeks. Prior to her ASD repair, she was documented to have normal coronary arteries in 2008.  I am scheduling her for Amber [name] echo Doppler study, which will be helpful preoperatively to reassess LV function and valvular architecture.  I had a long discussion with her today concerning her marked hyperlipidemia.  She apparently cannot take statins but has been taking niacin in addition to over-the-counter fish oil 4 capsules daily.  I had a long discussion regarding potential candidacy for PCSK9 therapy and also had our Pharm.Rise Paganini discussed this with her.  Time spent: 25 minutes  Troy Sine, MD, [facility]  01/27/2015 3:25 PM

## 2015-01-30 ENCOUNTER — Encounter: Payer: Self-pay | Admitting: Cardiovascular Disease

## 2015-02-01 ENCOUNTER — Other Ambulatory Visit: Payer: Self-pay

## 2015-02-01 ENCOUNTER — Ambulatory Visit (HOSPITAL_COMMUNITY): Payer: Medicare Other | Attending: Cardiology

## 2015-02-01 DIAGNOSIS — E785 Hyperlipidemia, unspecified: Secondary | ICD-10-CM | POA: Insufficient documentation

## 2015-02-01 DIAGNOSIS — I10 Essential (primary) hypertension: Secondary | ICD-10-CM | POA: Diagnosis not present

## 2015-02-01 DIAGNOSIS — Z6841 Body Mass Index (BMI) 40.0 and over, adult: Secondary | ICD-10-CM | POA: Insufficient documentation

## 2015-02-01 DIAGNOSIS — Z87891 Personal history of nicotine dependence: Secondary | ICD-10-CM | POA: Diagnosis not present

## 2015-02-01 DIAGNOSIS — Z01810 Encounter for preprocedural cardiovascular examination: Secondary | ICD-10-CM

## 2015-02-01 DIAGNOSIS — Z01818 Encounter for other preprocedural examination: Secondary | ICD-10-CM | POA: Insufficient documentation

## 2015-02-07 ENCOUNTER — Telehealth: Payer: Self-pay | Admitting: Cardiovascular Disease

## 2015-02-07 NOTE — Telephone Encounter (Signed)
Request for surgical clearance:  1. What type of surgery is being performed? D&C Hysteroscopy (medical clearance)   2. When is this surgery scheduled? 11/30  3. Are there any medications that need to be held prior to surgery and how long? No   4. Name of physician performing surgery? Dr. Lottie Mussel' Keffe  5. What is your office phone and fax number? [phone])  6.

## 2015-02-08 ENCOUNTER — Encounter: Payer: Self-pay | Admitting: *Deleted

## 2015-02-10 NOTE — Telephone Encounter (Signed)
Echo from 02/01/15 reviewed; EF 60 -65%.  [place] for surgery

## 2015-02-11 NOTE — Telephone Encounter (Signed)
Pt clearance information faxed to Dr. Deniece Ree office.

## 2015-04-22 MED FILL — LATANOPROST 0.005% EYE DRP: 0.005 | 25 days supply | Qty: 3 | Fill #4

## 2015-04-22 MED FILL — ESCITALOPRAM 20 MG TABLET: 20 | 30 days supply | Qty: 30 | Fill #5

## 2015-04-22 MED FILL — SPIRONOLACTONE 25 MG TABLET: 25 | 30 days supply | Qty: 60 | Fill #4

## 2015-04-22 MED FILL — FUROSEMIDE 80 MG TABLET: 80 | 30 days supply | Qty: 45 | Fill #5

## 2015-04-22 MED FILL — KLOR-CON M10 TABLET: 10 | 30 days supply | Qty: 120 | Fill #3

## 2015-04-22 MED FILL — NIACIN ER 1,000 MG TABLET: 1000 | 30 days supply | Qty: 30 | Fill #5

## 2015-04-22 MED FILL — METOPROLOL SUCC ER 25 MG TA: 25 | 30 days supply | Qty: 30 | Fill #5

## 2015-04-22 MED FILL — SYNTHROID 137 MCG TABLET: 137 | 30 days supply | Qty: 30 | Fill #4

## 2015-05-23 MED FILL — SYNTHROID 137 MCG TABLET: 137 | 30 days supply | Qty: 30 | Fill #5

## 2015-05-23 MED FILL — LATANOPROST 0.005% EYE DRP: 0.005 | 25 days supply | Qty: 3 | Fill #5

## 2015-05-23 MED FILL — SPIRONOLACTONE 25 MG TABLET: 25 | 30 days supply | Qty: 60 | Fill #5

## 2015-05-23 MED FILL — ESCITALOPRAM 20 MG TABLET: 20 | 30 days supply | Qty: 30 | Fill #6

## 2015-05-23 MED FILL — METOPROLOL SUCC ER 25 MG TA: 25 | 30 days supply | Qty: 30 | Fill #6

## 2015-06-24 ENCOUNTER — Other Ambulatory Visit: Payer: Self-pay | Admitting: Cardiovascular Disease

## 2015-06-24 MED FILL — METOPROLOL SUCC ER 25 MG TA: 25 | 30 days supply | Qty: 30 | Fill #7

## 2015-06-24 MED FILL — ESCITALOPRAM 20 MG TABLET: 20 | 30 days supply | Qty: 30 | Fill #7

## 2015-06-24 MED FILL — SYNTHROID 137 MCG TABLET: 137 | 30 days supply | Qty: 30 | Fill #6

## 2015-06-24 MED FILL — SPIRONOLACTONE 25 MG TABLET: 25 | 30 days supply | Qty: 60 | Fill #6

## 2015-06-24 MED FILL — KLOR-CON M10 TABLET: 10 | 30 days supply | Qty: 120 | Fill #4

## 2015-06-24 MED FILL — FUROSEMIDE 80 MG TABLET: 80 | 30 days supply | Qty: 45 | Fill #6

## 2015-06-24 MED FILL — LATANOPROST 0.005% EYE DRP: 0.005 | 25 days supply | Qty: 3 | Fill #6

## 2015-06-26 NOTE — Telephone Encounter (Signed)
Rx(s) sent to pharmacy electronically.  

## 2015-07-26 MED FILL — KLOR-CON M10 TABLET: 10 | 30 days supply | Qty: 120 | Fill #5

## 2015-07-26 MED FILL — ESCITALOPRAM 20 MG TABLET: 20 | 30 days supply | Qty: 30 | Fill #8

## 2015-07-26 MED FILL — LATANOPROST 0.005% EYE DRP: 0.005 | 25 days supply | Qty: 3 | Fill #7

## 2015-07-26 MED FILL — metOLazone 2.5 MG TABS: 2.5 | 74 days supply | Qty: 30 | Fill #0

## 2015-07-26 MED FILL — FUROSEMIDE 80 MG TABLET: 80 | 30 days supply | Qty: 45 | Fill #7

## 2015-07-26 MED FILL — METOPROLOL SUCC ER 25 MG TA: 25 | 30 days supply | Qty: 30 | Fill #8

## 2015-07-26 MED FILL — SYNTHROID 137 MCG TABLET: 137 | 30 days supply | Qty: 30 | Fill #7

## 2015-07-26 MED FILL — SPIRONOLACTONE 25 MG TABLET: 25 | 30 days supply | Qty: 60 | Fill #7

## 2015-08-29 MED FILL — SYNTHROID 137 MCG TABLET: 137 | 30 days supply | Qty: 30 | Fill #8

## 2015-08-29 MED FILL — METOPROLOL SUCC ER 25 MG TA: 25 | 30 days supply | Qty: 30 | Fill #9

## 2015-08-29 MED FILL — SPIRONOLACTONE 25 MG TABLET: 25 | 30 days supply | Qty: 60 | Fill #8

## 2015-08-29 MED FILL — ESCITALOPRAM 20 MG TABLET: 20 | 30 days supply | Qty: 30 | Fill #9

## 2015-08-29 MED FILL — LATANOPROST 0.005% EYE DRP: 0.005 | 25 days supply | Qty: 3 | Fill #8

## 2015-08-30 ENCOUNTER — Encounter (HOSPITAL_BASED_OUTPATIENT_CLINIC_OR_DEPARTMENT_OTHER): Payer: Self-pay

## 2015-08-30 ENCOUNTER — Emergency Department (HOSPITAL_BASED_OUTPATIENT_CLINIC_OR_DEPARTMENT_OTHER)
Admission: EM | Admit: 2015-08-30 | Discharge: 2015-08-30 | Disposition: A | Discharge status: Home / Self Care | Payer: Medicare Other | Attending: Emergency Medicine | Admitting: Emergency Medicine

## 2015-08-30 DIAGNOSIS — Y939 Activity, unspecified: Secondary | ICD-10-CM | POA: Diagnosis not present

## 2015-08-30 DIAGNOSIS — E039 Hypothyroidism, unspecified: Secondary | ICD-10-CM | POA: Diagnosis not present

## 2015-08-30 DIAGNOSIS — E785 Hyperlipidemia, unspecified: Secondary | ICD-10-CM | POA: Diagnosis not present

## 2015-08-30 DIAGNOSIS — M1990 Unspecified osteoarthritis, unspecified site: Secondary | ICD-10-CM | POA: Insufficient documentation

## 2015-08-30 DIAGNOSIS — W57XXXA Bitten or stung by nonvenomous insect and other nonvenomous arthropods, initial encounter: Secondary | ICD-10-CM | POA: Diagnosis not present

## 2015-08-30 DIAGNOSIS — F329 Major depressive disorder, single episode, unspecified: Secondary | ICD-10-CM | POA: Insufficient documentation

## 2015-08-30 DIAGNOSIS — I10 Essential (primary) hypertension: Secondary | ICD-10-CM | POA: Diagnosis not present

## 2015-08-30 DIAGNOSIS — Y999 Unspecified external cause status: Secondary | ICD-10-CM | POA: Diagnosis not present

## 2015-08-30 DIAGNOSIS — Y929 Unspecified place or not applicable: Secondary | ICD-10-CM | POA: Diagnosis not present

## 2015-08-30 DIAGNOSIS — L03116 Cellulitis of left lower limb: Secondary | ICD-10-CM | POA: Insufficient documentation

## 2015-08-30 DIAGNOSIS — E119 Type 2 diabetes mellitus without complications: Secondary | ICD-10-CM | POA: Diagnosis not present

## 2015-08-30 DIAGNOSIS — S41052A Open bite of left shoulder, initial encounter: Secondary | ICD-10-CM | POA: Diagnosis present

## 2015-08-30 MED ORDER — DOXYCYCLINE HYCLATE 100 MG PO CAPS: 100.0000 mg | ORAL_CAPSULE | Freq: Two times a day (BID) | ORAL | Status: DC

## 2015-08-30 NOTE — ED Notes (Signed)
Patient updated about MD status.

## 2015-08-30 NOTE — ED Provider Notes (Signed)
CSN: [id number]     Arrival date & time 08/30/15  1645 History  By signing my name below, I, Arianna Nassar and Roxine Caddy, attest that this documentation has been prepared under the direction and in the presence of Harvel Quale, MD. Electronically Signed: Julien Nordmann, ED Scribe. 08/30/2015. 7:43 PM.    Chief Complaint  Patient presents with  . Tick bite    The history is provided by the patient. No language interpreter was used.   HPI Comments: Amber Cook is a 70 y.o. female who has a PMHx of HLD, HTN, hypercalemia, DM, and GERD. presents to the Emergency Department complaining of a sudden onset, gradual worsening, moderate, tick bite to her left scapular region onset 3 days ago. She has a small reddened area where the tick once was. Pt notes trying to remove the tick two days ago but is unsure if she pulled it out completely. She denies neck pain and any other symptoms or complaints.   Past Medical History  Diagnosis Date  . Diastolic dysfunction 0000000    grade 1  . Hyperlipidemia     takes Welchol bid   . Hypertension     takes Metoprolol nightly   . Pneumonia     hx of->a yr ago  . History of bronchitis     a month ago  . Sleep apnea     supposed to use CPAP;repeat study tonight @ [place]  . Peripheral neuropathy ([place])   . Hypercalcemia   . Elevated parathyroid hormone   . Arthritis   . Vaginal yeast infection   . Diabetes mellitus without complication (HCC)     borderline  . GERD (gastroesophageal reflux disease)     but doesn't require meds  . Colitis   . Internal hemorrhoids   . Urinary frequency     takes Lasix daily  . Hypothyroidism     takes Synthroid daily  . Depression     takes Lexapro daily  . ASD (atrial septal defect), ostium secundum     surgical repair 7/08  . Venous insufficiency     SVG ablation   Past Surgical History  Procedure Laterality Date  . Tonsillectomy and adenoidectomy      and adenoids at age 65  . Atral septal  defect repair  July 2009    after failed ASD closure  . Right vein ligation  2009  . Cervical biopsy    . Left breast biopsy    . Colonoscopy    . Cardiac catheterization  07/21/2006    Right & Left: normal coronary arteries, mod. pulmonary hypertension w/evidence of left to right shunting.  . 2d echo with bubble study  03/31/12  . Parathyroid exploration  04/05/2012    Procedure: PARATHYROID EXPLORATION;  Surgeon: Pedro Earls, MD;  Location: [facility];  Service: General;  Laterality: N/A;  Exploration of parathryroid and removal of left and right interior parathyroid  . Parathyroidectomy    . Transesophageal echocardiogram  08/13/2006    unsuccessful attempt at ASD closure  . Nm myocar perf wall motion  07/15/2006    high risk scan -   Family History  Problem Relation Age of Onset  . Cancer Mother     leukemia  . Cancer Maternal Uncle     leukemia  . Cancer Maternal Grandfather     stomach  . Pneumonia Father   . Heart attack Maternal Grandmother   . Heart failure Paternal Grandfather   . Kidney  disease Paternal Grandfather   . Diabetes Sister   . Hypertension Sister   . Hyperlipidemia Sister   . Asthma Sister   . Diabetes Sister   . Hyperlipidemia Sister   . Hypertension Sister    Social History  Substance Use Topics  . Smoking status: Never Smoker   . Smokeless tobacco: Never Used  . Alcohol Use: No   OB History    No data available     Review of Systems  Constitutional: Negative for fever.  Musculoskeletal: Negative for neck pain.  Skin: Positive for wound.  All other systems reviewed and are negative.     Allergies  Erythromycin; Lidocaine; Other; Prilosec; Statins; and Vicodin  Home Medications   Prior to Admission medications   Medication Sig Start Date End Date Taking? Authorizing Provider  aspirin 325 MG tablet Take 325 mg by mouth daily.    Historical Provider, MD  escitalopram (LEXAPRO) 20 MG tablet Take 1 tablet by mouth daily. 01/12/14    Historical Provider, MD  furosemide (LASIX) 80 MG tablet Take 1 tablet by mouth daily. 01/01/14   Historical Provider, MD  latanoprost (XALATAN) 0.005 % ophthalmic solution Place 1 drop into both eyes daily. 01/15/15   Historical Provider, MD  metolazone (ZAROXOLYN) 2.5 MG tablet Take 1 tablet (2.5 mg total) by mouth as directed. Take every 2-3 days 30 minutes prior to morning dose of furosemide. 06/26/15   Troy Sine, MD  metoprolol succinate (TOPROL-XL) 25 MG 24 hr tablet Take 1 tablet (25 mg total) by mouth daily. 06/19/13   Mihai Croitoru, MD  potassium chloride (K-DUR,KLOR-CON) 10 MEQ tablet TAKE 1 TO 3 TABLETS BY MOUTH DAILY AS DIRECTED 12/04/14   Lorretta Harp, MD  spironolactone (ALDACTONE) 25 MG tablet Take 2 tablets by mouth daily. 01/07/15   Historical Provider, MD  SYNTHROID 137 MCG tablet Take 1 tablet by mouth daily. 01/14/15   Historical Provider, MD  traMADol (ULTRAM) 50 MG tablet Take 50 mg by mouth every 6 (six) hours as needed. pain    Historical Provider, MD   Triage vitals: BP 132/68 mmHg  Pulse 70  Temp(Src) 98.8 F (37.1 C) (Oral)  Resp 22  Ht 5' 6.25" (1.683 m)  Wt 286 lb (129.729 kg)  BMI 45.80 kg/m2  SpO2 98% Physical Exam  Constitutional: She is oriented to person, place, and time. She appears well-developed and well-nourished. No distress.  HENT:  Head: Normocephalic and atraumatic.  Right Ear: External ear normal.  Left Ear: External ear normal.  Nose: Nose normal.  Mouth/Throat: Oropharynx is clear and moist. No oropharyngeal exudate.  Eyes: EOM are normal. Pupils are equal, round, and reactive to light.  Neck: Normal range of motion. Neck supple.  Cardiovascular: Normal rate, regular rhythm, normal heart sounds and intact distal pulses.   No murmur heard. Pulmonary/Chest: Effort normal. No respiratory distress. She has no wheezes. She has no rales.  Abdominal: Soft. She exhibits no distension. There is no tenderness.  Musculoskeletal: Normal range of  motion. She exhibits no edema or tenderness.  Neurological: She is alert and oriented to person, place, and time.  Skin: Skin is warm and dry. She is not diaphoretic. There is erythema.     Nursing note and vitals reviewed.   ED Course  Procedures DIAGNOSTIC STUDIES: Oxygen Saturation is 98% on RA, normal by my interpretation.  COORDINATION OF CARE: 7:51 PM Discussed treatment plan which include administer doxycycline and follow up with PCP with pt at bedside and pt  agreed to plan.  Labs Review Labs Reviewed - No data to display  Imaging Review No results found. I have personally reviewed and evaluated these images and lab results as part of my medical decision-making.   EKG Interpretation None      MDM  Patient seen and evaluated in stable condition.  Small area of skin consistent with infection and previous bite without residual tick parts.  Patient prescribed Doxycyline and instructed to follow up with her PCP.  Patient discharged home in stable condition. Final diagnoses:  None    1. Tick bite  2. Cellulitis I personally performed the services described in this documentation, which was scribed in my presence. The recorded information has been reviewed and is accurate.     Harvel Quale, MD 09/02/15 1143

## 2015-08-30 NOTE — Discharge Instructions (Signed)
You were seen and evaluated today for your tick bite. It appears that you remove entire tick. There is surrounding redness where the tick bite was. Likely the skin is partially infected. The medication we are prescribing will help to treat any infection in the scan and also help protect your against tick borne illnesses.  Tick Bite Information Ticks are insects that attach themselves to the skin. There are many types of ticks. Common types include wood ticks and deer ticks. Sometimes, ticks carry diseases that can make a person very ill. The most common places for ticks to attach themselves are the scalp, neck, armpits, waist, and groin.  HOW CAN YOU PREVENT TICK BITES? Take these steps to help prevent tick bites when you are outdoors:  Wear long sleeves and long pants.  Wear white clothes so you can see ticks more easily.  Tuck your pant legs into your socks.  If walking on a trail, stay in the middle of the trail to avoid brushing against bushes.  Avoid walking through areas with long grass.  Put bug spray on all skin that is showing and along boot tops, pant legs, and sleeve cuffs.  Check clothes, hair, and skin often and before going inside.  Brush off any ticks that are not attached.  Take a shower or bath as soon as possible after being outdoors. HOW SHOULD YOU REMOVE A TICK? Ticks should be removed as soon as possible to help prevent diseases. 1. If latex gloves are available, put them on before trying to remove a tick. 2. Use tweezers to grasp the tick as close to the skin as possible. You may also use curved forceps or a tick removal tool. Grasp the tick as close to its head as possible. Avoid grasping the tick on its body. 3. Pull gently upward until the tick lets go. Do not twist the tick or jerk it suddenly. This may break off the tick's head or mouth parts. 4. Do not squeeze or crush the tick's body. This could force disease-carrying fluids from the tick into your  body. 5. After the tick is removed, wash the bite area and your hands with soap and water or alcohol. 6. Apply a small amount of antiseptic cream or ointment to the bite site. 7. Wash any tools that were used. Do not try to remove a tick by applying a hot match, petroleum jelly, or fingernail polish to the tick. These methods do not work. They may also increase the chances of disease being spread from the tick bite. WHEN SHOULD YOU SEEK HELP? Contact your health care provider if you are unable to remove a tick or if a part of the tick breaks off in the skin. After a tick bite, you need to watch for signs and symptoms of diseases that can be spread by ticks. Contact your health care provider if you develop any of the following:  Fever.  Rash.  Redness and puffiness (swelling) in the area of the tick bite.  Tender, puffy lymph glands.  Watery poop (diarrhea).  Weight loss.  Cough.  Feeling more tired than normal (fatigue).  Muscle, joint, or bone pain.  Belly (abdominal) pain.  Headache.  Change in your level of consciousness.  Trouble walking or moving your legs.  Loss of feeling (numbness) in the legs.  Loss of movement (paralysis).  Shortness of breath.  Confusion.  Throwing up (vomiting) many times.   This information is not intended to replace advice given to you by  your health care provider. Make sure you discuss any questions you have with your health care provider.   Document Released: 06/03/2009 Document Revised: 11/09/2012 Document Reviewed: 08/17/2012 Elsevier Interactive Patient Education [employer].

## 2015-08-30 NOTE — ED Notes (Signed)
No noted distress at this time. Small area of redness noted to her back in an area where a tick was removed

## 2015-08-30 NOTE — ED Notes (Signed)
Tick bite to back Monday-removed Tuesday-c/o area with dark circle-pt denies fever, chills-positive HA-NAD-steady gait

## 2015-10-01 MED FILL — LATANOPROST 0.005% EYE DRP: 0.005 | 25 days supply | Qty: 3 | Fill #9

## 2015-10-01 MED FILL — METOPROLOL SUCC ER 25 MG TA: 25 | 30 days supply | Qty: 30 | Fill #10

## 2015-10-01 MED FILL — FUROSEMIDE 80 MG TABLET: 80 | 30 days supply | Qty: 45 | Fill #8

## 2015-10-01 MED FILL — SYNTHROID 137 MCG TABLET: 137 | 30 days supply | Qty: 30 | Fill #9

## 2015-10-01 MED FILL — ESCITALOPRAM 20 MG TABLET: 20 | 30 days supply | Qty: 30 | Fill #10

## 2015-10-21 MED FILL — SPIRONOLACTONE 25 MG TABLET: 25 | 30 days supply | Qty: 60 | Fill #9

## 2015-10-21 MED FILL — CLARITHROMYCIN 500 MG TAB: 500 | 10 days supply | Qty: 20 | Fill #0

## 2015-10-21 MED FILL — TORSEMIDE 20 MG TABLET: 20 | 30 days supply | Qty: 60 | Fill #0 | Status: TO

## 2015-10-21 MED FILL — BENZONATATE 200 MG CAPSULE: 200 | 13 days supply | Qty: 40 | Fill #0

## 2015-11-20 MED FILL — ESCITALOPRAM 20 MG TABLET: 20 | 30 days supply | Qty: 30 | Fill #0

## 2015-11-20 MED FILL — METOPROLOL SUCC ER 25 MG TA: 25 | 30 days supply | Qty: 30 | Fill #0

## 2015-11-20 MED FILL — SYNTHROID 137 MCG TABLET: 137 | 30 days supply | Qty: 30 | Fill #0

## 2015-11-20 MED FILL — SPIRONOLACTONE 25 MG TABLET: 25 | 30 days supply | Qty: 60 | Fill #10

## 2015-11-20 MED FILL — POTASSIUM CL ER 10 MEQ TAB: 10 | 90 days supply | Qty: 360 | Fill #0 | Status: TO

## 2016-01-01 MED FILL — ESCITALOPRAM 20 MG TABLET: 20 | 30 days supply | Qty: 30 | Fill #0 | Status: TO

## 2016-01-01 MED FILL — METOPROLOL SUCC ER 25 MG TA: 25 | 30 days supply | Qty: 30 | Fill #0 | Status: TO

## 2016-01-01 MED FILL — LATANOPROST 0.005% EYE DRP: 0.005 | 23 days supply | Qty: 3 | Fill #0 | Status: TO

## 2016-01-01 MED FILL — SYNTHROID 137 MCG TABLET: 137 | 30 days supply | Qty: 30 | Fill #0 | Status: TO

## 2016-01-01 MED FILL — SPIRONOLACTONE 25 MG TABLET: 25 | 30 days supply | Qty: 60 | Fill #0 | Status: TO

## 2016-01-09 MED FILL — FLUCONAZOLE 100 MG TABLET: 100 | 10 days supply | Qty: 10 | Fill #0

## 2016-02-07 MED FILL — SYNTHROID 137 MCG TABLET: 137 | 30 days supply | Qty: 30 | Fill #0 | Status: TO

## 2016-02-27 MED FILL — LATANOPROST 0.005% EYE DRP: 0.005 | 30 days supply | Qty: 3 | Fill #0

## 2016-02-28 MED FILL — TORSEMIDE 20 MG TABLET: 20 | 30 days supply | Qty: 60 | Fill #0 | Status: TO

## 2016-04-16 DIAGNOSIS — R05 Cough: Secondary | ICD-10-CM | POA: Diagnosis not present

## 2016-04-16 DIAGNOSIS — I10 Essential (primary) hypertension: Secondary | ICD-10-CM | POA: Diagnosis not present

## 2016-04-16 DIAGNOSIS — E032 Hypothyroidism due to medicaments and other exogenous substances: Secondary | ICD-10-CM | POA: Diagnosis not present

## 2016-06-30 DIAGNOSIS — I10 Essential (primary) hypertension: Secondary | ICD-10-CM | POA: Diagnosis not present

## 2016-06-30 DIAGNOSIS — R609 Edema, unspecified: Secondary | ICD-10-CM | POA: Diagnosis not present

## 2016-06-30 DIAGNOSIS — E032 Hypothyroidism due to medicaments and other exogenous substances: Secondary | ICD-10-CM | POA: Diagnosis not present

## 2016-06-30 MED FILL — FUROSEMIDE 80 MG TABLET: 80 | 90 days supply | Qty: 180 | Fill #0 | Status: TO

## 2016-07-28 DIAGNOSIS — I10 Essential (primary) hypertension: Secondary | ICD-10-CM | POA: Diagnosis not present

## 2016-08-04 DIAGNOSIS — E032 Hypothyroidism due to medicaments and other exogenous substances: Secondary | ICD-10-CM | POA: Diagnosis not present

## 2016-08-04 DIAGNOSIS — R609 Edema, unspecified: Secondary | ICD-10-CM | POA: Diagnosis not present

## 2016-08-04 DIAGNOSIS — I10 Essential (primary) hypertension: Secondary | ICD-10-CM | POA: Diagnosis not present

## 2016-08-13 ENCOUNTER — Encounter: Payer: Self-pay | Admitting: *Deleted

## 2016-08-13 DIAGNOSIS — Z006 Encounter for examination for normal comparison and control in clinical research program: Secondary | ICD-10-CM

## 2016-08-13 NOTE — Progress Notes (Signed)
  CLEAR Informed Consent  Subject Name: Amber Cook. Sallis  Subject met inclusion and exclusion criteria. The informed consent form, study requirements and expectations were reviewed with the subject and questions and concerns were addressed prior to the signing of the consent form. The subject verbalized understanding of the trail requirements. The subject agreed to participate in the CLEAR trial and signed the informed consent. The informed consent was obtained prior to performance of any protocol-specific procedures for the subject. A copy of the signed informed consent was given to the subject and a copy was placed in the subject's medical record.  Jake Bathe Jr. 06/16/2016, 1020

## 2017-02-01 DIAGNOSIS — N390 Urinary tract infection, site not specified: Secondary | ICD-10-CM | POA: Diagnosis not present

## 2017-02-01 DIAGNOSIS — E032 Hypothyroidism due to medicaments and other exogenous substances: Secondary | ICD-10-CM | POA: Diagnosis not present

## 2017-02-01 DIAGNOSIS — E789 Disorder of lipoprotein metabolism, unspecified: Secondary | ICD-10-CM | POA: Diagnosis not present

## 2017-02-01 DIAGNOSIS — I10 Essential (primary) hypertension: Secondary | ICD-10-CM | POA: Diagnosis not present

## 2017-03-08 DIAGNOSIS — H524 Presbyopia: Secondary | ICD-10-CM | POA: Diagnosis not present

## 2017-03-08 DIAGNOSIS — H2513 Age-related nuclear cataract, bilateral: Secondary | ICD-10-CM | POA: Diagnosis not present

## 2017-03-08 DIAGNOSIS — H5213 Myopia, bilateral: Secondary | ICD-10-CM | POA: Diagnosis not present

## 2017-03-08 DIAGNOSIS — H40053 Ocular hypertension, bilateral: Secondary | ICD-10-CM | POA: Diagnosis not present

## 2017-05-03 DIAGNOSIS — I10 Essential (primary) hypertension: Secondary | ICD-10-CM | POA: Diagnosis not present

## 2017-05-03 DIAGNOSIS — I509 Heart failure, unspecified: Secondary | ICD-10-CM | POA: Diagnosis not present

## 2017-05-03 DIAGNOSIS — Z0000 Encounter for general adult medical examination without abnormal findings: Secondary | ICD-10-CM | POA: Diagnosis not present

## 2017-05-03 DIAGNOSIS — E039 Hypothyroidism, unspecified: Secondary | ICD-10-CM | POA: Diagnosis not present

## 2017-05-03 DIAGNOSIS — E7800 Pure hypercholesterolemia, unspecified: Secondary | ICD-10-CM | POA: Diagnosis not present

## 2017-06-24 DIAGNOSIS — I10 Essential (primary) hypertension: Secondary | ICD-10-CM | POA: Diagnosis not present

## 2017-06-24 DIAGNOSIS — E7800 Pure hypercholesterolemia, unspecified: Secondary | ICD-10-CM | POA: Diagnosis not present

## 2017-06-24 DIAGNOSIS — E789 Disorder of lipoprotein metabolism, unspecified: Secondary | ICD-10-CM | POA: Diagnosis not present

## 2017-06-24 DIAGNOSIS — Z79899 Other long term (current) drug therapy: Secondary | ICD-10-CM | POA: Diagnosis not present

## 2017-06-24 DIAGNOSIS — R42 Dizziness and giddiness: Secondary | ICD-10-CM | POA: Diagnosis not present

## 2017-06-24 MED FILL — AZITHROMYCIN 250 MG TABS: 250 | 5 days supply | Qty: 6 | Fill #0

## 2017-06-30 ENCOUNTER — Other Ambulatory Visit: Payer: Self-pay | Admitting: Internal Medicine

## 2017-06-30 DIAGNOSIS — R42 Dizziness and giddiness: Secondary | ICD-10-CM

## 2017-07-08 ENCOUNTER — Ambulatory Visit
Admission: RE | Admit: 2017-07-08 | Discharge: 2017-07-08 | Disposition: A | Discharge status: Home / Self Care | Payer: Medicare HMO | Source: Ambulatory Visit | Attending: Internal Medicine | Admitting: Internal Medicine

## 2017-07-08 DIAGNOSIS — E039 Hypothyroidism, unspecified: Secondary | ICD-10-CM | POA: Diagnosis not present

## 2017-07-08 DIAGNOSIS — I10 Essential (primary) hypertension: Secondary | ICD-10-CM | POA: Diagnosis not present

## 2017-07-08 DIAGNOSIS — R42 Dizziness and giddiness: Secondary | ICD-10-CM | POA: Diagnosis not present

## 2017-08-10 DIAGNOSIS — K0889 Other specified disorders of teeth and supporting structures: Secondary | ICD-10-CM | POA: Diagnosis not present

## 2017-08-10 DIAGNOSIS — I10 Essential (primary) hypertension: Secondary | ICD-10-CM | POA: Diagnosis not present

## 2017-08-20 DIAGNOSIS — R739 Hyperglycemia, unspecified: Secondary | ICD-10-CM | POA: Diagnosis not present

## 2017-08-20 DIAGNOSIS — E032 Hypothyroidism due to medicaments and other exogenous substances: Secondary | ICD-10-CM | POA: Diagnosis not present

## 2017-08-20 DIAGNOSIS — E789 Disorder of lipoprotein metabolism, unspecified: Secondary | ICD-10-CM | POA: Diagnosis not present

## 2017-08-20 DIAGNOSIS — I10 Essential (primary) hypertension: Secondary | ICD-10-CM | POA: Diagnosis not present

## 2017-08-20 DIAGNOSIS — E039 Hypothyroidism, unspecified: Secondary | ICD-10-CM | POA: Diagnosis not present

## 2017-11-29 DIAGNOSIS — H2513 Age-related nuclear cataract, bilateral: Secondary | ICD-10-CM | POA: Diagnosis not present

## 2017-11-29 DIAGNOSIS — H40053 Ocular hypertension, bilateral: Secondary | ICD-10-CM | POA: Diagnosis not present

## 2017-12-13 DIAGNOSIS — R739 Hyperglycemia, unspecified: Secondary | ICD-10-CM | POA: Diagnosis not present

## 2017-12-13 DIAGNOSIS — E789 Disorder of lipoprotein metabolism, unspecified: Secondary | ICD-10-CM | POA: Diagnosis not present

## 2017-12-13 DIAGNOSIS — E032 Hypothyroidism due to medicaments and other exogenous substances: Secondary | ICD-10-CM | POA: Diagnosis not present

## 2017-12-15 DIAGNOSIS — H40053 Ocular hypertension, bilateral: Secondary | ICD-10-CM | POA: Diagnosis not present

## 2017-12-15 DIAGNOSIS — H2513 Age-related nuclear cataract, bilateral: Secondary | ICD-10-CM | POA: Diagnosis not present

## 2017-12-22 DIAGNOSIS — H2511 Age-related nuclear cataract, right eye: Secondary | ICD-10-CM | POA: Diagnosis not present

## 2017-12-22 DIAGNOSIS — H2513 Age-related nuclear cataract, bilateral: Secondary | ICD-10-CM | POA: Diagnosis not present

## 2017-12-27 DIAGNOSIS — I10 Essential (primary) hypertension: Secondary | ICD-10-CM | POA: Diagnosis not present

## 2017-12-27 DIAGNOSIS — E039 Hypothyroidism, unspecified: Secondary | ICD-10-CM | POA: Diagnosis not present

## 2017-12-27 DIAGNOSIS — E7800 Pure hypercholesterolemia, unspecified: Secondary | ICD-10-CM | POA: Diagnosis not present

## 2018-01-18 DIAGNOSIS — H2511 Age-related nuclear cataract, right eye: Secondary | ICD-10-CM | POA: Diagnosis not present

## 2018-01-18 DIAGNOSIS — H25811 Combined forms of age-related cataract, right eye: Secondary | ICD-10-CM | POA: Diagnosis not present

## 2018-05-04 DIAGNOSIS — Z0000 Encounter for general adult medical examination without abnormal findings: Secondary | ICD-10-CM | POA: Diagnosis not present

## 2018-05-04 DIAGNOSIS — E032 Hypothyroidism due to medicaments and other exogenous substances: Secondary | ICD-10-CM | POA: Diagnosis not present

## 2018-05-04 DIAGNOSIS — E789 Disorder of lipoprotein metabolism, unspecified: Secondary | ICD-10-CM | POA: Diagnosis not present

## 2018-05-04 DIAGNOSIS — E039 Hypothyroidism, unspecified: Secondary | ICD-10-CM | POA: Diagnosis not present

## 2018-05-04 DIAGNOSIS — I10 Essential (primary) hypertension: Secondary | ICD-10-CM | POA: Diagnosis not present

## 2018-05-04 DIAGNOSIS — R739 Hyperglycemia, unspecified: Secondary | ICD-10-CM | POA: Diagnosis not present

## 2018-05-04 DIAGNOSIS — E7800 Pure hypercholesterolemia, unspecified: Secondary | ICD-10-CM | POA: Diagnosis not present

## 2018-07-27 ENCOUNTER — Telehealth (INDEPENDENT_AMBULATORY_CARE_PROVIDER_SITE_OTHER): Payer: Medicare Other | Admitting: Cardiovascular Disease

## 2018-07-27 ENCOUNTER — Telehealth: Payer: Self-pay | Admitting: Cardiovascular Disease

## 2018-07-27 ENCOUNTER — Telehealth: Payer: Self-pay

## 2018-07-27 ENCOUNTER — Encounter: Payer: Self-pay | Admitting: Cardiovascular Disease

## 2018-07-27 VITALS — BP 147/89 | HR 72 | Ht 66.0 in | Wt 278.0 lb

## 2018-07-27 DIAGNOSIS — Q2111 Secundum atrial septal defect: Secondary | ICD-10-CM

## 2018-07-27 DIAGNOSIS — Z9989 Dependence on other enabling machines and devices: Secondary | ICD-10-CM

## 2018-07-27 DIAGNOSIS — I50810 Right heart failure, unspecified: Secondary | ICD-10-CM

## 2018-07-27 DIAGNOSIS — I872 Venous insufficiency (chronic) (peripheral): Secondary | ICD-10-CM

## 2018-07-27 DIAGNOSIS — G4733 Obstructive sleep apnea (adult) (pediatric): Secondary | ICD-10-CM

## 2018-07-27 DIAGNOSIS — I10 Essential (primary) hypertension: Secondary | ICD-10-CM

## 2018-07-27 DIAGNOSIS — E119 Type 2 diabetes mellitus without complications: Secondary | ICD-10-CM

## 2018-07-27 DIAGNOSIS — Q211 Atrial septal defect: Secondary | ICD-10-CM

## 2018-07-27 DIAGNOSIS — E782 Mixed hyperlipidemia: Secondary | ICD-10-CM

## 2018-07-27 DIAGNOSIS — Z8774 Personal history of (corrected) congenital malformations of heart and circulatory system: Secondary | ICD-10-CM

## 2018-07-27 DIAGNOSIS — E349 Endocrine disorder, unspecified: Secondary | ICD-10-CM

## 2018-07-27 DIAGNOSIS — I5189 Other ill-defined heart diseases: Secondary | ICD-10-CM

## 2018-07-27 MED ORDER — ASPIRIN 81 MG PO TABS: 81.0000 mg | ORAL_TABLET | Freq: Every day | ORAL | 3 refills | Status: AC

## 2018-07-27 NOTE — Telephone Encounter (Signed)
New message:   Please call patient back she is calling to update he insurance.

## 2018-07-27 NOTE — Patient Instructions (Addendum)
Medication Instructions:  Your physician has recommended you make the following change in your medication:   DECREASE YOUR ASPIRIN TO 81 MG BY MOUTH DAILY  If you need a refill on your cardiac medications before your next appointment, please call your pharmacy.   Lab work: Your physician recommends that you return for fasting lab work in our office in June IF you haven't had a [place] lab drawn by your Primary Care Provider's office. If you have, please call our office to report that your blood work has been completed. YOU WILL RECEIVE A LAB SLIP IN THE MAIL. PLEASE DO NOT EAT OR DRINK (EXCEPT WATER) ANYTHING AFTER MIDNIGHT ON THE DAY YOU CHOOSE TO PRESENT FOR LAB WORK. YOU MAY EAT AFTER YOUR BLOOD HAS BEEN COLLECTED. NO APPOINTMENT IS NEEDED.  If you have labs (blood work) drawn today and your tests are completely normal, you will receive your results only by: Marland Kitchen MyChart Message (if you have MyChart) OR . A paper copy in the mail If you have any lab test that is abnormal or we need to change your treatment, we will call you to review the results.  Testing/Procedures: Your physician has requested that you have an echocardiogram. Echocardiography is a painless test that uses sound waves to create images of your heart. It provides your doctor with information about the size and shape of your heart and how well your heart's chambers and valves are working. This procedure takes approximately one hour. There are no restrictions for this procedure. LOCATION: [address], [place], [place] [address] TO BE SCHEDULED FOR A DATE IN AUGUST 2020. YOU WILL BE CONTACTED BY A SCHEDULER TO SET UP THIS APPOINTMENT    Follow-Up: At [facility], you and your health needs are our priority.  As part of our continuing mission to provide you with exceptional heart care, we have created designated Provider Care Teams.  These Care Teams include your primary Cardiologist (physician) and Advanced  Practice Providers (APPs -  Physician Assistants and Nurse Practitioners) who all work together to provide you with the care you need, when you need it. You will need a follow up appointment in 4 months (September 2020).  Please call our office 2 months in advance to schedule this appointment.  You may see Dr. Sallyanne Kuster or one of the following Advanced Practice Providers on your designated Care Team: Almyra Deforest, [place] . Fabian Sharp, PA-C  Any Other Special Instructions Will Be Listed Below (If Applicable). ONE OF OUR CLINICAL PHARMACISTS WILL BE IN CONTACT WITH YOU TO DISCUSS REPATHA, A MEDICATION THAT HELPS LOWER YOUR "BAD" CHOLESTEROL.

## 2018-07-27 NOTE — Telephone Encounter (Signed)
Patient and/or DPR-approved person aware of AVS instructions and verbalized understanding. Letter including After Visit Summary and any other necessary documents to be mailed to the patient's address on file.  

## 2018-07-27 NOTE — Progress Notes (Signed)
Virtual Visit via Video Note   This visit type was conducted due to national recommendations for restrictions regarding the COVID-19 Pandemic (e.g. social distancing) in an effort to limit this patient's exposure and mitigate transmission in our community.  Due to her co-morbid illnesses, this patient is at least at moderate risk for complications without adequate follow up.  This format is felt to be most appropriate for this patient at this time.  All issues noted in this document were discussed and addressed.  A limited physical exam was performed with this format.  Please refer to the patient's chart for her consent to telehealth for [facility].   Date:  07/27/2018   ID:  Amber Cook, DOB 04/07/1945, MRN [id number]  Patient Location: Other:  parked car Provider Location: Home  PCP:  Anda Kraft, MD  Cardiologist:  Marilena Trevathan Electrophysiologist:  None   Evaluation Performed:  New Patient Evaluation. Last previous Cardiology visit in November 2016 with Dr. [facility]; last visit with me March 2015  Chief Complaint:  Edema  History of Present Illness:    Amber Cook is a 73 y.o. female with a history of ostium secundum atrial septal defect closed in 2008 (failed percutaneous closure followed by surgery), morbid obesity, obstructive sleep apnea, mixed hyperlipidemia (intolerance to multiple statins and ezetimibe), left ventricular diastolic dysfunction, mild pulmonary artery hypertension, peripheral venous insufficiency with previous right greater saphenous vein ablation, hypothyroidism and hyperparathyroidism.  Her edema has generally been reasonably well-controlled in her right leg, but she has intermittent swelling of the left.  She usually takes 80 mg of furosemide daily, but sometimes is able to skip this.  She decides whether or not to take a diuretic based on how small the left legs.  She has not required any additional doses.  She has had occasional bouts of  dizziness and has noticed that her blood pressure was low with a systolic around 387.  Recently she has lost about 20 pounds from her previous maximum.  She does not have dyspnea at rest (no orthopnea, no PND) and does not have any worsening in her baseline shortness of breath with activity.  Overall she is quite sedentary.  She denies any chest pain, palpitations or full-blown syncope.  She did not have any coronary stenoses at her preoperative angiogram in 2008.  She has severely elevated total cholesterol and LDL cholesterol (usually LDL is greater than 200) but has been intolerant of numerous statins including pravastatin, atorvastatin, rosuvastatin and Livalo.  She also had side effects with Zetia.  All of these cause myalgia.  There was a plan to enroll her in the CLEAR trial of bempedoic acid, but these are dropped since follow-up cannot be arranged.  PCSK9 inhibitors were discussed with her several years ago, at that time the cost was very high.  She is planning to have repeat labs in about a month's time at Dr. Julianne Rice office.  Her last echocardiogram in November 2016 showed normal left ventricular systolic function (EF 56-43%), grade 1 diastolic dysfunction, normal pulmonary artery pressure (although no calculation provided), normal right ventricular size and function and normal right atrial size, no mention of residual shunt  The patient does not have symptoms concerning for COVID-19 infection (fever, chills, cough, or new shortness of breath).    Past Medical History:  Diagnosis Date  . Arthritis   . ASD (atrial septal defect), ostium secundum    surgical repair 7/08  . Colitis   . Depression  takes Lexapro daily  . Diabetes mellitus without complication (HCC)    borderline  . Diastolic dysfunction 6/81/27   grade 1  . Elevated parathyroid hormone   . GERD (gastroesophageal reflux disease)    but doesn't require meds  . History of bronchitis    a month ago  . Hypercalcemia   .  Hyperlipidemia    takes Welchol bid   . Hypertension    takes Metoprolol nightly   . Hypothyroidism    takes Synthroid daily  . Internal hemorrhoids   . Peripheral neuropathy   . Pneumonia    hx of->a yr ago  . Sleep apnea    supposed to use CPAP;repeat study tonight @ [place]  . Urinary frequency    takes Lasix daily  . Vaginal yeast infection   . Venous insufficiency    SVG ablation   Past Surgical History:  Procedure Laterality Date  . 2d echo with bubble study  03/31/12  . atral septal defect repair  July 2009   after failed ASD closure  . CARDIAC CATHETERIZATION  07/21/2006   Right & Left: normal coronary arteries, mod. pulmonary hypertension w/evidence of left to right shunting.  . CERVICAL BIOPSY    . COLONOSCOPY    . left breast biopsy    . NM MYOCAR PERF WALL MOTION  07/15/2006   high risk scan -  . PARATHYROID EXPLORATION  04/05/2012   Procedure: PARATHYROID EXPLORATION;  Surgeon: Pedro Earls, MD;  Location: [place];  Service: General;  Laterality: N/A;  Exploration of parathryroid and removal of left and right interior parathyroid  . PARATHYROIDECTOMY    . right vein ligation  2009  . TONSILLECTOMY AND ADENOIDECTOMY     and adenoids at age 25  . TRANSESOPHAGEAL ECHOCARDIOGRAM  08/13/2006   unsuccessful attempt at ASD closure     Current Meds  Medication Sig  . aspirin 325 MG tablet Take 325 mg by mouth daily.  Marland Kitchen escitalopram (LEXAPRO) 20 MG tablet Take 1 tablet by mouth daily.  . furosemide (LASIX) 80 MG tablet Take 1 tablet by mouth daily.  Marland Kitchen latanoprost (XALATAN) 0.005 % ophthalmic solution Place 1 drop into both eyes daily.  Marland Kitchen levothyroxine (SYNTHROID) 150 MCG tablet Take 150 mcg by mouth daily.  . metoprolol succinate (TOPROL-XL) 25 MG 24 hr tablet Take 1 tablet (25 mg total) by mouth daily.  . potassium chloride (K-DUR,KLOR-CON) 10 MEQ tablet TAKE 1 TO 3 TABLETS BY MOUTH DAILY AS DIRECTED  . spironolactone (ALDACTONE) 25 MG tablet Take 2 tablets by  mouth daily.     Allergies:   Erythromycin; Lidocaine; Other; Prilosec [omeprazole]; Statins; and Vicodin [hydrocodone-acetaminophen]   Social History   Tobacco Use  . Smoking status: Never Smoker  . Smokeless tobacco: Never Used  Substance Use Topics  . Alcohol use: No  . Drug use: No     Family Hx: The patient's family history includes Asthma in her sister; Cancer in her maternal grandfather, maternal uncle, and mother; Diabetes in her sister and sister; Heart attack in her maternal grandmother; Heart failure in her paternal grandfather; Hyperlipidemia in her sister and sister; Hypertension in her sister and sister; Kidney disease in her paternal grandfather; Pneumonia in her father.  ROS:   Please see the history of present illness.    She complains of tinnitus, worse in the left ear compared to the right All other systems reviewed and are negative.   Prior CV studies:   The following studies were reviewed  today: Last echo from 2014 Labs/Other Tests and Data Reviewed:    EKG:  An ECG dated January 25, 2015 was personally reviewed today and demonstrated:  Sinus rhythm, generalized low voltage, nonspecific repolarization changes  Recent Labs: No results found for requested labs within last 8760 hours.   Recent Lipid Panel Lab Results  Component Value Date/Time   CHOL 279 (H) 04/01/2012 02:36 PM   CHOL 282 (H) 04/01/2012 02:36 PM   TRIG 256 (H) 04/01/2012 02:36 PM   TRIG 267 (H) 04/01/2012 02:36 PM   HDL 44 04/01/2012 02:36 PM   HDL 45 04/01/2012 02:36 PM   CHOLHDL 6.3 04/01/2012 02:36 PM   CHOLHDL 6.3 04/01/2012 02:36 PM   LDLCALC 184 (H) 04/01/2012 02:36 PM   LDLCALC 184 (H) 04/01/2012 02:36 PM    Wt Readings from Last 3 Encounters:  07/27/18 278 lb (126.1 kg)  08/13/16 299 lb (135.6 kg)  08/30/15 286 lb (129.7 kg)     Objective:    Vital Signs:  BP (!) 147/89   Pulse 72   Ht 5\' 6"  (1.676 m)   Wt 278 lb (126.1 kg)   BMI 44.87 kg/m    VITAL SIGNS:   reviewed GEN:  no acute distress EYES:  sclerae anicteric, EOMI - Extraocular Movements Intact RESPIRATORY:  normal respiratory effort, symmetric expansion CARDIOVASCULAR:  no peripheral edema SKIN:  no rash, lesions or ulcers. MUSCULOSKELETAL:  no obvious deformities. NEURO:  alert and oriented x 3, no obvious focal deficit PSYCH:  normal affect Obesity  ASSESSMENT & PLAN:    1. Edema: Spent a long time discussing the fact that her left leg swelling may be due to local problems (venous insufficiency) as well as systemic fluid excess (hypervolemia in the setting of right heart failure).  Under 1 situation she would best treat the problem by elevating the limb, whereas if she has hypervolemia she should take that diuretic.  The amount of swelling will not be necessarily representative of the cause of the problem, but she should monitor her weights on a daily basis to identify her optimal "dry weight" and adjust her diuretic prescription accordingly.  Reminded her about the importance of sodium restriction.  Since she is trying to lose real weight its possible we will have to constantly readjust her "dry weight" target. 2. RHF: Right heart dysfunction due to pulmonary hypertension secondary to obstructive sleep apnea and residual effects of an atrial septal defect that was closed when she was in her 65s.  Would like to repeat an echocardiogram once the coronavirus restrictions are less stringent.  We will arrange a follow-up visit after that 3. HLP: She probably has familial heterozygous hypercholesterolemia.  Even with a history of normal coronary arteries, her LDL cholesterol is high enough that she should consider PCSK9 inhibitors.  After we retrieve the labs from next months PCP follow-up, will talk to her about starting [place]. 4. Tinnitus: This is predominantly is associated with some equilibrium issues as well.  Most likely to be related to her inner ear, but I did recommend that she reduce the  dose of aspirin to 81 mg daily.  In fact, at this point I am not sure that the aspirin is really serving any purpose in the absence of known coronary/vascular disease and 12 years after closure of her ASD. 5. ASD closure: Evaluate by echo in a few months. 6. OSA: Recommended 100% CPAP compliance and encouraged her in her efforts to lose weight. 7. Morbid obesity  COVID-19  Education: The signs and symptoms of COVID-19 were discussed with the patient and how to seek care for testing (follow up with PCP or arrange E-visit).  The importance of social distancing was discussed today.  Time:   Today, I have spent 23 minutes with the patient with telehealth technology discussing the above problems.     Medication Adjustments/Labs and Tests Ordered: Current medicines are reviewed at length with the patient today.  Concerns regarding medicines are outlined above.   Tests Ordered: No orders of the defined types were placed in this encounter.   Medication Changes: No orders of the defined types were placed in this encounter.   Disposition:  Follow up 3-4 months, after echocardiogram and after reassessing the lipid profile  Signed, Sanda Klein, MD  07/27/2018 10:47 AM    [place]

## 2018-08-26 DIAGNOSIS — E039 Hypothyroidism, unspecified: Secondary | ICD-10-CM | POA: Diagnosis not present

## 2018-08-26 DIAGNOSIS — I10 Essential (primary) hypertension: Secondary | ICD-10-CM | POA: Diagnosis not present

## 2018-08-26 DIAGNOSIS — E789 Disorder of lipoprotein metabolism, unspecified: Secondary | ICD-10-CM | POA: Diagnosis not present

## 2018-09-02 DIAGNOSIS — E789 Disorder of lipoprotein metabolism, unspecified: Secondary | ICD-10-CM | POA: Diagnosis not present

## 2018-09-02 DIAGNOSIS — E7800 Pure hypercholesterolemia, unspecified: Secondary | ICD-10-CM | POA: Diagnosis not present

## 2018-09-02 DIAGNOSIS — E039 Hypothyroidism, unspecified: Secondary | ICD-10-CM | POA: Diagnosis not present

## 2018-09-02 DIAGNOSIS — I10 Essential (primary) hypertension: Secondary | ICD-10-CM | POA: Diagnosis not present

## 2018-09-06 ENCOUNTER — Telehealth: Payer: Self-pay | Admitting: Pharmacist

## 2018-09-06 NOTE — Telephone Encounter (Signed)
LMOM; patient to call back to discuss options for Lipid management. Dr C recommendation is Repatha for familial hypercholesterolemia (baseline LDL = 200mg  on 5/31/202 per [facility])

## 2018-09-07 NOTE — Telephone Encounter (Signed)
LMOM x2 ; patient to call back and discuss therapeutic options for cholesterol management. Main recommendation Repatha.

## 2018-10-10 DIAGNOSIS — H40053 Ocular hypertension, bilateral: Secondary | ICD-10-CM | POA: Diagnosis not present

## 2018-10-10 DIAGNOSIS — Z961 Presence of intraocular lens: Secondary | ICD-10-CM | POA: Diagnosis not present

## 2018-10-10 DIAGNOSIS — H2512 Age-related nuclear cataract, left eye: Secondary | ICD-10-CM | POA: Diagnosis not present

## 2018-11-18 ENCOUNTER — Other Ambulatory Visit: Payer: Self-pay

## 2018-11-18 ENCOUNTER — Ambulatory Visit (HOSPITAL_COMMUNITY): Payer: Medicare Other | Attending: Cardiology

## 2018-11-18 ENCOUNTER — Telehealth: Payer: Self-pay | Admitting: *Deleted

## 2018-11-18 DIAGNOSIS — I50810 Right heart failure, unspecified: Secondary | ICD-10-CM

## 2018-11-18 NOTE — Telephone Encounter (Signed)
Call placed to the patient. Was unable to leave a message.

## 2018-11-18 NOTE — Telephone Encounter (Signed)
-----   Message from Sanda Klein, MD sent at 11/18/2018  2:45 PM EDT ----- Good news on the echocardiogram.  The left ventricle is a little thick, but has normal contraction and only mild relaxation problems. On the current echocardiogram there is reliable indication that she has normal right heart filling pressures and left heart filling pressures (which means that there is no sign of overall excess fluid in her system). Any residual swelling that she may be experiencing is likely due to a local problems in the leg such as venous insufficiency/varicose veins. There is no evidence of any residual ASD/"hole in the heart". She does have very mild pulmonary hypertension, which is related to excess weight and the permanent effects of an ASD that was closed later in life.

## 2018-11-21 NOTE — Telephone Encounter (Signed)
Patient made aware of results and verbalized understanding.  

## 2018-12-01 ENCOUNTER — Encounter

## 2018-12-29 ENCOUNTER — Ambulatory Visit: Payer: Medicare Other | Admitting: Cardiovascular Disease

## 2018-12-29 ENCOUNTER — Other Ambulatory Visit: Payer: Self-pay

## 2018-12-29 VITALS — BP 123/73 | HR 74 | Temp 97.0°F | Ht 66.0 in | Wt 250.0 lb

## 2018-12-29 DIAGNOSIS — E782 Mixed hyperlipidemia: Secondary | ICD-10-CM

## 2018-12-29 DIAGNOSIS — I50812 Chronic right heart failure: Secondary | ICD-10-CM | POA: Diagnosis not present

## 2018-12-29 DIAGNOSIS — I10 Essential (primary) hypertension: Secondary | ICD-10-CM

## 2018-12-29 DIAGNOSIS — Q211 Atrial septal defect: Secondary | ICD-10-CM

## 2018-12-29 DIAGNOSIS — G4733 Obstructive sleep apnea (adult) (pediatric): Secondary | ICD-10-CM | POA: Diagnosis not present

## 2018-12-29 DIAGNOSIS — I872 Venous insufficiency (chronic) (peripheral): Secondary | ICD-10-CM

## 2018-12-29 DIAGNOSIS — Q2111 Secundum atrial septal defect: Secondary | ICD-10-CM

## 2018-12-29 NOTE — Progress Notes (Signed)
Cardiology Office Note:    Date:  12/30/2018   ID:  Amber Cook, DOB 06-Dec-1945, MRN [id number]  PCP:  Jani Gravel, MD  Cardiologist:  No primary care provider on file.  Electrophysiologist:  None   Referring MD: Anda Kraft, MD   Chief Complaint  Patient presents with  . Follow-up    ASD, PAH, OSA    History of Present Illness:    Amber Cook is a 73 y.o. female with a hx of ostium secundum atrial septal defect closed in 2008 after a failed percutaneous closure, morbid obesity, obstructive sleep apnea, mixed hyperlipidemia intolerant to statins, left ventricular diastolic dysfunction, mild pulmonary artery hypertension, peripheral venous insufficiency with history of ablation of the right greater saphenous vein, hypothyroidism and hyperparathyroidism.  She is generally done well since her last appointment with her only complaint being leg edema.  Most nights the edema resolves after overnight supine position but her feet will swell within an hour of standing up.  She has been self adjusting her dose of diuretic based on the amount of swelling and sometimes feels very tired and weak and avoids taking furosemide on those days.  She has not had any issues with shortness of breath.  Denies angina, palpitations, syncope, focal neurological events, bleeding or falls.  Her compliance with CPAP has been at best's body.  She did not have any coronary stenoses at her preoperative angiogram in 2008.  She has severely elevated total cholesterol and LDL cholesterol (usually LDL is greater than 200) but has been intolerant of numerous statins including pravastatin, atorvastatin, rosuvastatin and Livalo.  She also had side effects with Zetia.  All of these cause myalgia.  There was a plan to enroll her in the CLEAR trial of bempedoic acid, but these are dropped since follow-up cannot be arranged.  PCSK9 inhibitors were discussed with her several years ago, at that time the cost was very high.   She is planning to have repeat labs in about a month's time at Dr. Julianne Rice office.  Her last echocardiogram in November 2016 showed normal left ventricular systolic function (EF [id number]), grade 1 diastolic dysfunction, normal pulmonary artery pressure (although no calculation provided), normal right ventricular size and function and normal right atrial size, no mention of residual shunt.  A follow-up echocardiogram performed in August 2020 continues to show a mildly depressed right ventricle, but the right ventricular systolic pressure is only mildly elevated at 36 mmHg.  LV diastolic function parameters are consistent with impaired relaxation, but there is no evidence of elevated left atrial pressure.  Past Medical History:  Diagnosis Date  . Arthritis   . ASD (atrial septal defect), ostium secundum    surgical repair 7/08  . Colitis   . Depression    takes Lexapro daily  . Diabetes mellitus without complication (HCC)    borderline  . Diastolic dysfunction 0000000   grade 1  . Elevated parathyroid hormone   . GERD (gastroesophageal reflux disease)    but doesn't require meds  . History of bronchitis    a month ago  . Hypercalcemia   . Hyperlipidemia    takes Welchol bid   . Hypertension    takes Metoprolol nightly   . Hypothyroidism    takes Synthroid daily  . Internal hemorrhoids   . Peripheral neuropathy   . Pneumonia    hx of->a yr ago  . Sleep apnea    supposed to use CPAP;repeat study tonight @ [facility]  . Urinary  frequency    takes Lasix daily  . Vaginal yeast infection   . Venous insufficiency    SVG ablation    Past Surgical History:  Procedure Laterality Date  . 2d echo with bubble study  03/31/12  . atral septal defect repair  July 2009   after failed ASD closure  . CARDIAC CATHETERIZATION  07/21/2006   Right & Left: normal coronary arteries, mod. pulmonary hypertension w/evidence of left to right shunting.  . CERVICAL BIOPSY    . COLONOSCOPY    . left  breast biopsy    . NM MYOCAR PERF WALL MOTION  07/15/2006   high risk scan -  . PARATHYROID EXPLORATION  04/05/2012   Procedure: PARATHYROID EXPLORATION;  Surgeon: Pedro Earls, MD;  Location: [facility];  Service: General;  Laterality: N/A;  Exploration of parathryroid and removal of left and right interior parathyroid  . PARATHYROIDECTOMY    . right vein ligation  2009  . TONSILLECTOMY AND ADENOIDECTOMY     and adenoids at age 43  . TRANSESOPHAGEAL ECHOCARDIOGRAM  08/13/2006   unsuccessful attempt at ASD closure    Current Medications: Current Meds  Medication Sig  . aspirin 81 MG tablet Take 1 tablet (81 mg total) by mouth daily.  Marland Kitchen escitalopram (LEXAPRO) 20 MG tablet Take 1 tablet by mouth daily.  . furosemide (LASIX) 80 MG tablet Take 1 tablet by mouth daily.  Marland Kitchen latanoprost (XALATAN) 0.005 % ophthalmic solution Place 1 drop into both eyes daily.  Marland Kitchen levothyroxine (SYNTHROID) 150 MCG tablet Take 150 mcg by mouth daily.  . Melatonin 1 MG TABS Take by mouth.  . metoprolol succinate (TOPROL-XL) 25 MG 24 hr tablet Take 1 tablet (25 mg total) by mouth daily.  . potassium chloride (K-DUR,KLOR-CON) 10 MEQ tablet TAKE 1 TO 3 TABLETS BY MOUTH DAILY AS DIRECTED  . spironolactone (ALDACTONE) 25 MG tablet Take 2 tablets by mouth daily.     Allergies:   Erythromycin, Lidocaine, Other, Prilosec [omeprazole], Statins, and Vicodin [hydrocodone-acetaminophen]   Social History   Socioeconomic History  . Marital status: Single    Spouse name: Not on file  . Number of children: Not on file  . Years of education: Not on file  . Highest education level: Not on file  Occupational History  . Not on file  Social Needs  . Financial resource strain: Not on file  . Food insecurity    Worry: Not on file    Inability: Not on file  . Transportation needs    Medical: Not on file    Non-medical: Not on file  Tobacco Use  . Smoking status: Never Smoker  . Smokeless tobacco: Never Used  Substance and  Sexual Activity  . Alcohol use: No  . Drug use: No  . Sexual activity: Not on file  Lifestyle  . Physical activity    Days per week: Not on file    Minutes per session: Not on file  . Stress: Not on file  Relationships  . Social Herbalist on phone: Not on file    Gets together: Not on file    Attends religious service: Not on file    Active member of club or organization: Not on file    Attends meetings of clubs or organizations: Not on file    Relationship status: Not on file  Other Topics Concern  . Not on file  Social History Narrative  . Not on file     Family  History: The patient's family history includes Asthma in her sister; Cancer in her maternal grandfather, maternal uncle, and mother; Diabetes in her sister and sister; Heart attack in her maternal grandmother; Heart failure in her paternal grandfather; Hyperlipidemia in her sister and sister; Hypertension in her sister and sister; Kidney disease in her paternal grandfather; Pneumonia in her father.  ROS:   Please see the history of present illness.     All other systems reviewed and are negative.  EKGs/Labs/Other Studies Reviewed:    The following studies were reviewed today: Echocardiogram 11/18/2018  EKG:  EKG is  ordered today.  The ekg ordered today demonstrates normal sinus rhythm, generalized low voltage due to obesity, otherwise normal tracing  Recent Labs:  Recent Lipid Panel    Component Value Date/Time   CHOL 279 (H) 04/01/2012 1436   CHOL 282 (H) 04/01/2012 1436   TRIG 256 (H) 04/01/2012 1436   TRIG 267 (H) 04/01/2012 1436   HDL 44 04/01/2012 1436   HDL 45 04/01/2012 1436   CHOLHDL 6.3 04/01/2012 1436   CHOLHDL 6.3 04/01/2012 1436   VLDL 51 (H) 04/01/2012 1436   VLDL 53 (H) 04/01/2012 1436   LDLCALC 184 (H) 04/01/2012 1436   LDLCALC 184 (H) 04/01/2012 1436  08/26/2018 total cholesterol 263, HDL 41, LDL 185, triglycerides 185  Physical Exam:    VS:  BP 123/73   Pulse 74   Temp  (!) 97 F (36.1 C)   Ht 5\' 6"  (1.676 m)   Wt 250 lb (113.4 kg)   SpO2 98%   BMI 40.35 kg/m     Wt Readings from Last 3 Encounters:  12/29/18 250 lb (113.4 kg)  07/27/18 278 lb (126.1 kg)  08/13/16 299 lb (135.6 kg)     GEN: Morbidly obese well nourished, well developed in no acute distress HEENT: Normal NECK: No JVD; No carotid bruits LYMPHATICS: No lymphadenopathy CARDIAC: RRR, no murmurs, rubs, gallops RESPIRATORY:  Clear to auscultation without rales, wheezing or rhonchi  ABDOMEN: Soft, non-tender, non-distended MUSCULOSKELETAL: Symmetrical 2+ pedal edema, only trace ankle edema; No deformity  SKIN: Warm and dry NEUROLOGIC:  Alert and oriented x 3 PSYCHIATRIC:  Normal affect   ASSESSMENT:    1. Chronic right-sided heart failure ([place])   2. OSA (obstructive sleep apnea)   3. Mixed hyperlipidemia   4. ASD (atrial septal defect), ostium secundum   5. Morbid obesity ([place])   6. Essential hypertension   7. Peripheral venous insufficiency    PLAN:    In order of problems listed above:  1. RHF: Her edema is related to both generalized hypovolemia due to right heart failure as well as peripheral venous insufficiency with documented venous reflux and previous saphenectomy.  I advised using her weight as a better guide towards "as needed" administration of diuretics, rather than just the amount of swelling.  Reviewed sodium restricted diet.  Also reviewed the underlying pathophysiology of her right heart failure.  While there may be some component of fixed pulmonary arteriolar disease due to lifelong atrial septal defect, untreated obstructive sleep apnea is probably worsening the situation. 2. OSA: Recommended 100% compliance with CPAP therapy.  She does have daytime hypersomnolence and fatigue. 3. HLP: Intolerant to multiple statins and ezetimibe.  Has a markedly elevated LDL cholesterol but had clean coronaries at catheterization a few years ago.  She does not want to take PCSK9  inhibitors for primary prevention (cost). 4. ASD: No evidence of residual shunt by recent echo. 5. Morbid obesity:  Contributing to sleep apnea and pulmonary artery hypertension. 6. Peripheral venous insufficiency: Keep legs elevated or wear compression stockings throughout the day. 7. HTN:  Controlled.   Medication Adjustments/Labs and Tests Ordered: Current medicines are reviewed at length with the patient today.  Concerns regarding medicines are outlined above.  Orders Placed This Encounter  Procedures  . EKG 12-Lead   No orders of the defined types were placed in this encounter.   Patient Instructions  Medication Instructions:  Your physician recommends that you continue on your current medications as directed. Please refer to the Current Medication list given to you today.  If you need a refill on your cardiac medications before your next appointment, please call your pharmacy.   Lab work: None ordered If you have labs (blood work) drawn today and your tests are completely normal, you will receive your results only by: [place] (if you have MyChart) OR A paper copy in the mail If you have any lab test that is abnormal or we need to change your treatment, we will call you to review the results.  Testing/Procedures: None ordered  Follow-Up: At [facility], you and your health needs are our priority.  As part of our continuing mission to provide you with exceptional heart care, we have created designated Provider Care Teams.  These Care Teams include your primary Cardiologist (physician) and Advanced Practice Providers (APPs -  Physician Assistants and Nurse Practitioners) who all work together to provide you with the care you need, when you need it. You will need a follow up appointment in 12 months.  Please call our office 2 months in advance to schedule this appointment.  You may see Sanda Klein, MD or one of the following Advanced Practice Providers on your  designated Care Team: Almyra Deforest, [place] Fabian Sharp, [place]  Any Other Special Instructions Will Be Listed Below (If Applicable). Your physician recommends that you weigh yourself everyday at the same time, on the same scale and with the same amount of clothing. Please keep a record of these weights and bring to your next appointment.  Please wear compression stockings during the day.          Signed, Sanda Klein, MD  12/30/2018 3:37 PM    [place]

## 2018-12-29 NOTE — Patient Instructions (Addendum)
Medication Instructions:  Your physician recommends that you continue on your current medications as directed. Please refer to the Current Medication list given to you today.  If you need a refill on your cardiac medications before your next appointment, please call your pharmacy.   Lab work: None ordered If you have labs (blood work) drawn today and your tests are completely normal, you will receive your results only by: [place] (if you have MyChart) OR A paper copy in the mail If you have any lab test that is abnormal or we need to change your treatment, we will call you to review the results.  Testing/Procedures: None ordered  Follow-Up: At [facility], you and your health needs are our priority.  As part of our continuing mission to provide you with exceptional heart care, we have created designated Provider Care Teams.  These Care Teams include your primary Cardiologist (physician) and Advanced Practice Providers (APPs -  Physician Assistants and Nurse Practitioners) who all work together to provide you with the care you need, when you need it. You will need a follow up appointment in 12 months.  Please call our office 2 months in advance to schedule this appointment.  You may see Sanda Klein, MD or one of the following Advanced Practice Providers on your designated Care Team: Almyra Deforest, [place] Fabian Sharp, [place]  Any Other Special Instructions Will Be Listed Below (If Applicable). Your physician recommends that you weigh yourself everyday at the same time, on the same scale and with the same amount of clothing. Please keep a record of these weights and bring to your next appointment.  Please wear compression stockings during the day.

## 2018-12-30 ENCOUNTER — Encounter: Payer: Self-pay | Admitting: Cardiovascular Disease

## 2019-01-02 DIAGNOSIS — E789 Disorder of lipoprotein metabolism, unspecified: Secondary | ICD-10-CM | POA: Diagnosis not present

## 2019-01-02 DIAGNOSIS — R739 Hyperglycemia, unspecified: Secondary | ICD-10-CM | POA: Diagnosis not present

## 2019-01-02 DIAGNOSIS — I10 Essential (primary) hypertension: Secondary | ICD-10-CM | POA: Diagnosis not present

## 2019-01-02 DIAGNOSIS — E039 Hypothyroidism, unspecified: Secondary | ICD-10-CM | POA: Diagnosis not present

## 2019-01-09 DIAGNOSIS — E7800 Pure hypercholesterolemia, unspecified: Secondary | ICD-10-CM | POA: Diagnosis not present

## 2019-01-09 DIAGNOSIS — Z23 Encounter for immunization: Secondary | ICD-10-CM | POA: Diagnosis not present

## 2019-01-09 DIAGNOSIS — I272 Pulmonary hypertension, unspecified: Secondary | ICD-10-CM | POA: Diagnosis not present

## 2019-01-09 DIAGNOSIS — I10 Essential (primary) hypertension: Secondary | ICD-10-CM | POA: Diagnosis not present

## 2019-01-09 DIAGNOSIS — G4733 Obstructive sleep apnea (adult) (pediatric): Secondary | ICD-10-CM | POA: Diagnosis not present

## 2019-01-09 DIAGNOSIS — E039 Hypothyroidism, unspecified: Secondary | ICD-10-CM | POA: Diagnosis not present

## 2019-01-12 DIAGNOSIS — E7800 Pure hypercholesterolemia, unspecified: Secondary | ICD-10-CM | POA: Diagnosis not present

## 2019-01-12 DIAGNOSIS — I10 Essential (primary) hypertension: Secondary | ICD-10-CM | POA: Diagnosis not present

## 2019-01-13 DIAGNOSIS — I50812 Chronic right heart failure: Secondary | ICD-10-CM | POA: Diagnosis not present

## 2019-01-13 DIAGNOSIS — Z0000 Encounter for general adult medical examination without abnormal findings: Secondary | ICD-10-CM | POA: Diagnosis not present

## 2019-01-13 DIAGNOSIS — E1151 Type 2 diabetes mellitus with diabetic peripheral angiopathy without gangrene: Secondary | ICD-10-CM | POA: Diagnosis not present

## 2019-01-13 DIAGNOSIS — I739 Peripheral vascular disease, unspecified: Secondary | ICD-10-CM | POA: Diagnosis not present

## 2019-01-18 MED FILL — REPATHA SURECLICK 140 MG/ML: 140 | 28 days supply | Qty: 2 | Fill #0

## 2019-01-24 IMAGING — MR MR HEAD W/O CM
11 series · 48 of 48 positions shown · non-contrast
Comparison: None.

CLINICAL DATA: Dizziness

EXAM:
MRI HEAD WITHOUT CONTRAST
TECHNIQUE: Multiplanar, multiecho pulse sequences of the brain and surrounding
structures were obtained without intravenous contrast.

[Series 2: t1_se_sag · sagittal · 5.0mm · 0.45mm/px · 2 of 21 slices shown]
[im 1/21]
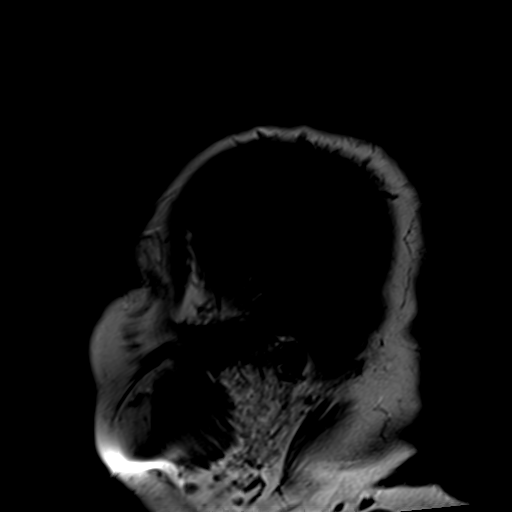
[im 21/21]
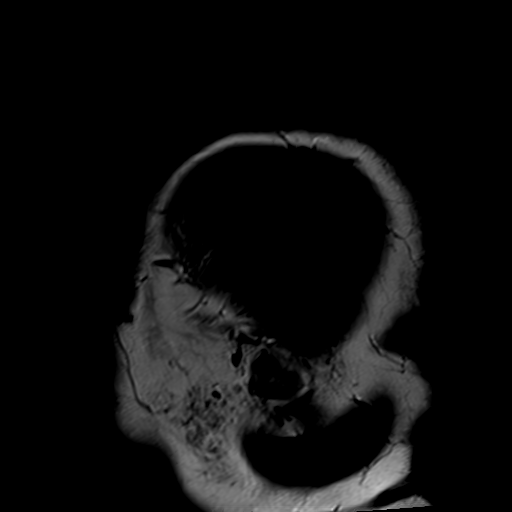

[Series 3: ep2d_diff_(id)_trace · axial · 3.0mm · 1.88mm/px · z∈[-74,+67]mm · 8 of 93 slices shown]
[im 1/93]
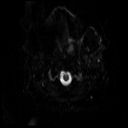
[im 14/93]
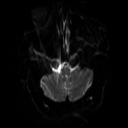
[im 27/93]
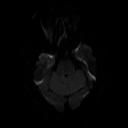
[im 40/93]
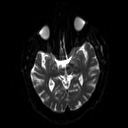
[im 53/93]
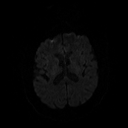
[im 66/93]
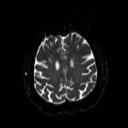
[im 79/93]
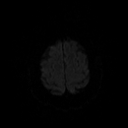
[im 93/93]
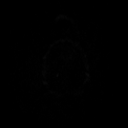

[Series 4: ep2d_diff_(id)_trace_adc · axial · 3.0mm · 1.88mm/px · z∈[-74,+67]mm · 4 of 48 slices shown]
[im 1/48]
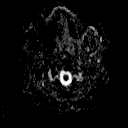
[im 16/48]
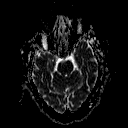
[im 32/48]
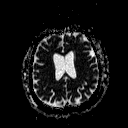
[im 48/48]
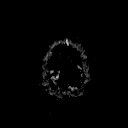

[Series 5: ep2d_diff_cor · coronal · 5.0mm · 1.77mm/px · 4 of 46 slices shown]
[im 1/46]
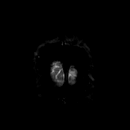
[im 16/46]
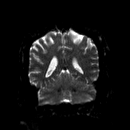
[im 31/46]
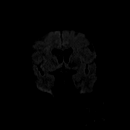
[im 46/46]
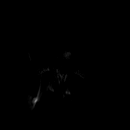

[Series 6: ep2d_diff_cor_adc · coronal · 5.0mm · 1.77mm/px · 2 of 24 slices shown]
[im 1/24]
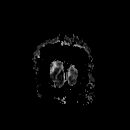
[im 24/24]
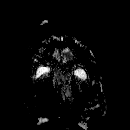

[Series 8: swi_images · axial · 2.0mm · 0.94mm/px · z∈[-81,+76]mm · 7 of 80 slices shown]
[im 1/80]
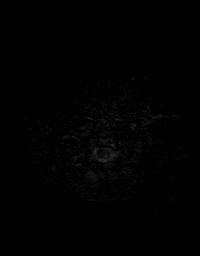
[im 14/80]
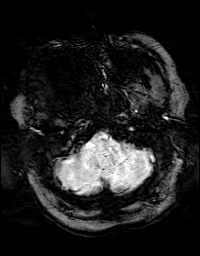
[im 27/80]
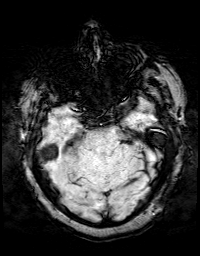
[im 40/80]
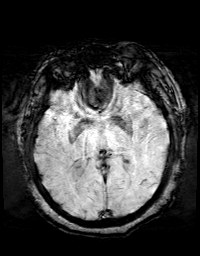
[im 53/80]
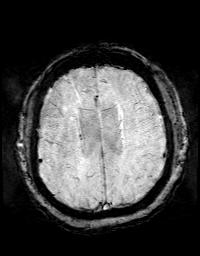
[im 66/80]
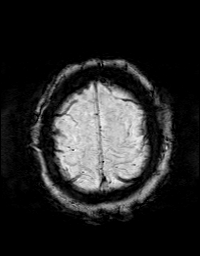
[im 80/80]
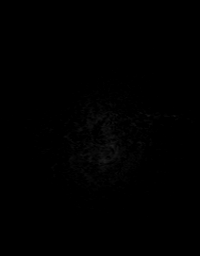

[Series 9: FLAIR · axial · 3.0mm · 0.45mm/px · z∈[-81,+75]mm · 2 of 27 slices shown (1 of 2)]
[im 1/27]
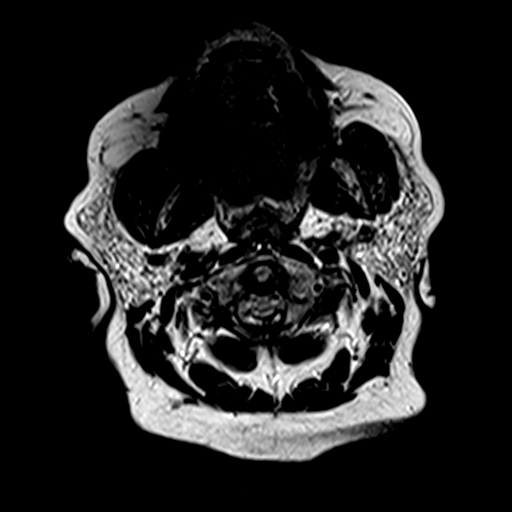
[im 27/27]
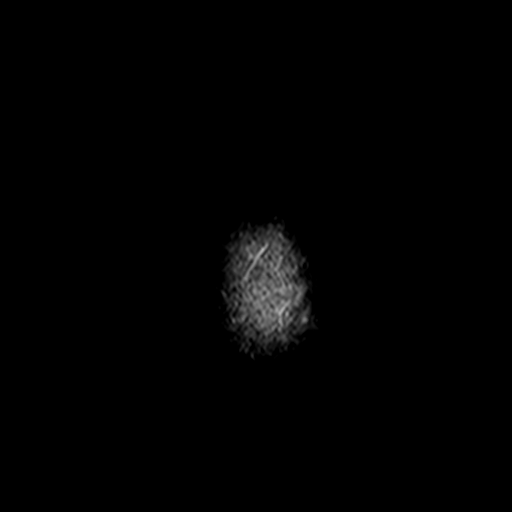

[Series 10: t2_tse_tra_512 · axial · 5.0mm · 0.60mm/px · z∈[-72,+66]mm · 2 of 24 slices shown]
[im 1/24]
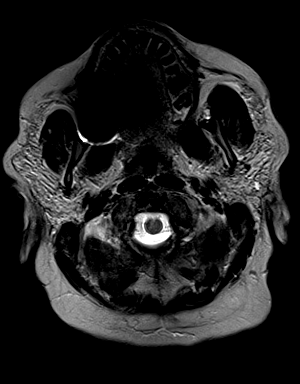
[im 24/24]
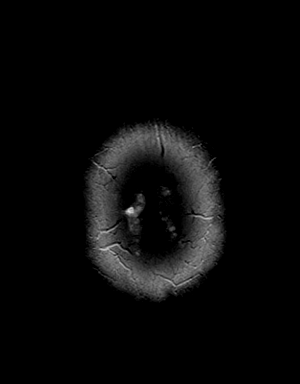

[Series 11: t1_mpr_tra · axial · 1.0mm · 0.72mm/px · z∈[-82,+77]mm · 13 of 160 slices shown]
[im 1/160]
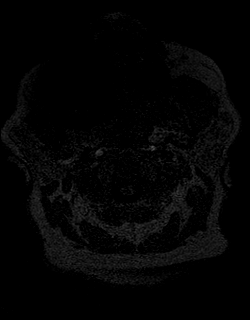
[im 14/160]
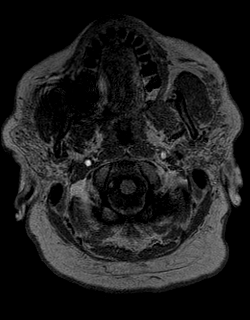
[im 27/160]
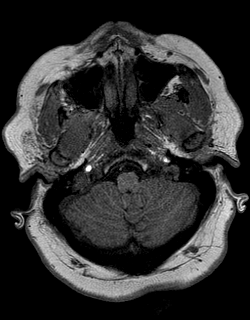
[im 40/160]
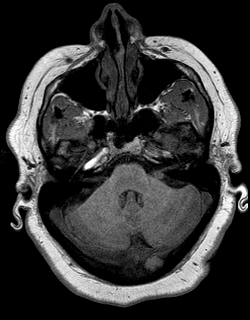
[im 54/160]
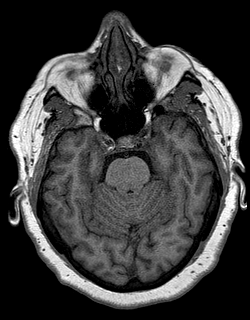
[im 67/160]
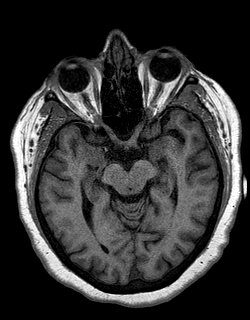
[im 80/160]
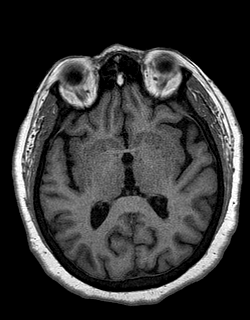
[im 93/160]
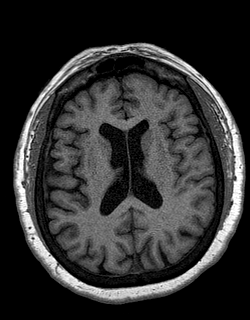
[im 107/160]
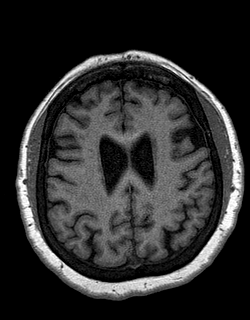
[im 120/160]
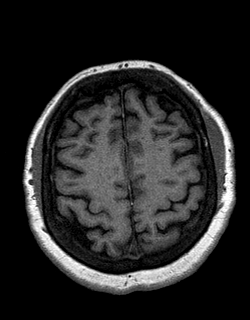
[im 133/160]
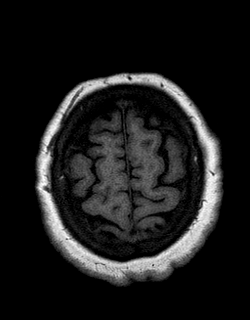
[im 146/160]
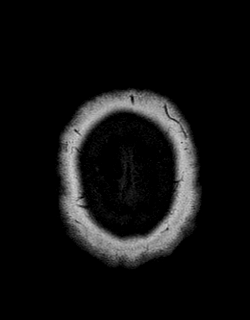
[im 160/160]
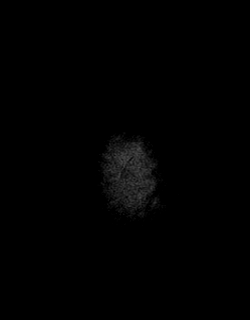

[Series 12: T2 · coronal · 5.0mm · 0.45mm/px · 2 of 26 slices shown]
[im 1/26]
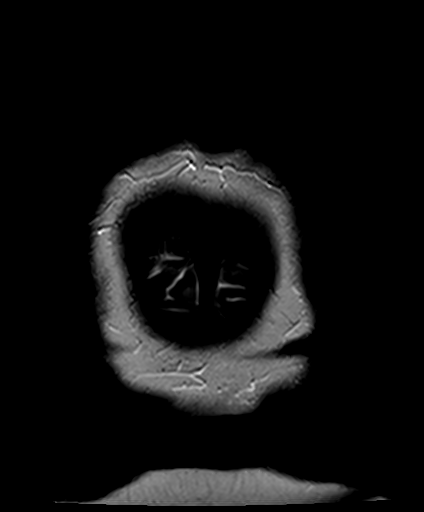
[im 26/26]
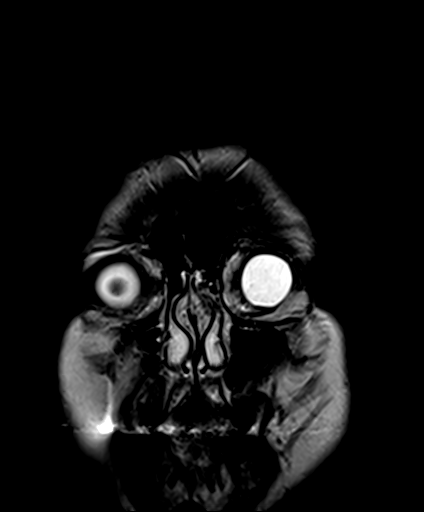

[Series 13: FLAIR · sagittal · 5.0mm · 0.45mm/px · 2 of 25 slices shown (2 of 2)]
[im 1/25]
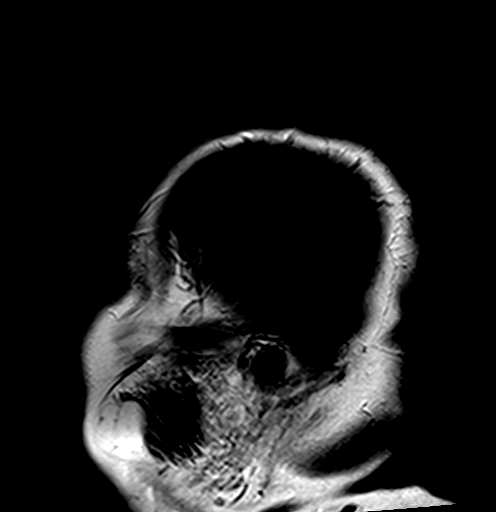
[im 25/25]
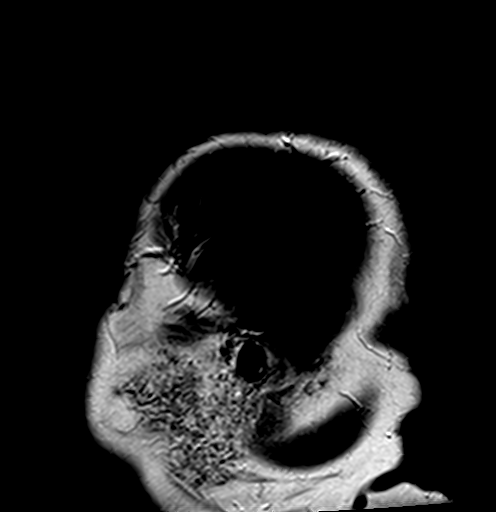

[48 of 48 positions shown; findings below may reference images not displayed]

FINDINGS: Brain: Cerebral volume and ventricular size normal for age. Negative
for acute infarct. Mild chronic white matter changes most likely
microvascular ischemia. Brainstem and cerebellum normal. Negative
for hemorrhage mass or edema. No midline shift.

Vascular: Normal arterial flow void

Skull and upper cervical spine: Negative

Sinuses/Orbits: Negative

Other: None
IMPRESSION: No acute intracranial abnormality. Mild chronic microvascular
ischemia in the white matter.

## 2019-02-13 MED FILL — REPATHA SURECLICK 140 MG/ML: 140 | 28 days supply | Qty: 2 | Fill #1

## 2019-03-20 MED FILL — REPATHA SURECLICK 140 MG/ML: 140 | 28 days supply | Qty: 2 | Fill #2

## 2019-04-21 DIAGNOSIS — H2512 Age-related nuclear cataract, left eye: Secondary | ICD-10-CM | POA: Diagnosis not present

## 2019-04-21 DIAGNOSIS — Z961 Presence of intraocular lens: Secondary | ICD-10-CM | POA: Diagnosis not present

## 2019-04-21 DIAGNOSIS — H40053 Ocular hypertension, bilateral: Secondary | ICD-10-CM | POA: Diagnosis not present

## 2019-04-21 DIAGNOSIS — H43393 Other vitreous opacities, bilateral: Secondary | ICD-10-CM | POA: Diagnosis not present

## 2019-04-25 MED FILL — REPATHA SURECLICK 140 MG/ML: 140 | 28 days supply | Qty: 2 | Fill #3

## 2019-05-19 DIAGNOSIS — E039 Hypothyroidism, unspecified: Secondary | ICD-10-CM | POA: Diagnosis not present

## 2019-05-19 DIAGNOSIS — I509 Heart failure, unspecified: Secondary | ICD-10-CM | POA: Diagnosis not present

## 2019-05-19 DIAGNOSIS — E7800 Pure hypercholesterolemia, unspecified: Secondary | ICD-10-CM | POA: Diagnosis not present

## 2019-05-19 DIAGNOSIS — R7303 Prediabetes: Secondary | ICD-10-CM | POA: Diagnosis not present

## 2019-05-19 DIAGNOSIS — I10 Essential (primary) hypertension: Secondary | ICD-10-CM | POA: Diagnosis not present

## 2019-05-29 MED FILL — REPATHA SURECLICK 140 MG/ML: 140 | 28 days supply | Qty: 2 | Fill #4

## 2019-07-10 MED FILL — REPATHA SURECLICK 140 MG/ML: 140 | 28 days supply | Qty: 2 | Fill #5

## 2019-09-04 MED FILL — REPATHA SURECLICK 140 MG/ML: 140 | 28 days supply | Qty: 2 | Fill #7

## 2019-09-19 DIAGNOSIS — R7303 Prediabetes: Secondary | ICD-10-CM | POA: Diagnosis not present

## 2019-09-19 DIAGNOSIS — E039 Hypothyroidism, unspecified: Secondary | ICD-10-CM | POA: Diagnosis not present

## 2019-09-19 DIAGNOSIS — I10 Essential (primary) hypertension: Secondary | ICD-10-CM | POA: Diagnosis not present

## 2019-09-19 DIAGNOSIS — Z0000 Encounter for general adult medical examination without abnormal findings: Secondary | ICD-10-CM | POA: Diagnosis not present

## 2019-09-19 DIAGNOSIS — E7800 Pure hypercholesterolemia, unspecified: Secondary | ICD-10-CM | POA: Diagnosis not present

## 2019-10-04 MED FILL — REPATHA SURECLICK 140 MG/ML: 140 | 28 days supply | Qty: 2 | Fill #8

## 2019-10-19 DIAGNOSIS — Z961 Presence of intraocular lens: Secondary | ICD-10-CM | POA: Diagnosis not present

## 2019-10-19 DIAGNOSIS — H40053 Ocular hypertension, bilateral: Secondary | ICD-10-CM | POA: Diagnosis not present

## 2019-10-19 DIAGNOSIS — H2512 Age-related nuclear cataract, left eye: Secondary | ICD-10-CM | POA: Diagnosis not present

## 2019-11-08 MED FILL — REPATHA SURECLICK 140 MG/ML: 140 | 28 days supply | Qty: 2 | Fill #9

## 2019-12-04 DIAGNOSIS — R5381 Other malaise: Secondary | ICD-10-CM | POA: Diagnosis not present

## 2019-12-04 DIAGNOSIS — E559 Vitamin D deficiency, unspecified: Secondary | ICD-10-CM | POA: Diagnosis not present

## 2019-12-04 DIAGNOSIS — E039 Hypothyroidism, unspecified: Secondary | ICD-10-CM | POA: Diagnosis not present

## 2019-12-04 DIAGNOSIS — Z03818 Encounter for observation for suspected exposure to other biological agents ruled out: Secondary | ICD-10-CM | POA: Diagnosis not present

## 2019-12-04 DIAGNOSIS — E7800 Pure hypercholesterolemia, unspecified: Secondary | ICD-10-CM | POA: Diagnosis not present

## 2019-12-04 DIAGNOSIS — I10 Essential (primary) hypertension: Secondary | ICD-10-CM | POA: Diagnosis not present

## 2019-12-04 DIAGNOSIS — Z9181 History of falling: Secondary | ICD-10-CM | POA: Diagnosis not present

## 2019-12-04 DIAGNOSIS — I509 Heart failure, unspecified: Secondary | ICD-10-CM | POA: Diagnosis not present

## 2019-12-04 DIAGNOSIS — R29898 Other symptoms and signs involving the musculoskeletal system: Secondary | ICD-10-CM | POA: Diagnosis not present

## 2020-03-03 DIAGNOSIS — Z20822 Contact with and (suspected) exposure to covid-19: Secondary | ICD-10-CM | POA: Diagnosis not present

## 2020-03-13 DIAGNOSIS — E7800 Pure hypercholesterolemia, unspecified: Secondary | ICD-10-CM | POA: Diagnosis not present

## 2020-03-13 DIAGNOSIS — R7303 Prediabetes: Secondary | ICD-10-CM | POA: Diagnosis not present

## 2020-03-19 DIAGNOSIS — E7800 Pure hypercholesterolemia, unspecified: Secondary | ICD-10-CM | POA: Diagnosis not present

## 2020-03-19 DIAGNOSIS — I10 Essential (primary) hypertension: Secondary | ICD-10-CM | POA: Diagnosis not present

## 2020-03-19 DIAGNOSIS — R7303 Prediabetes: Secondary | ICD-10-CM | POA: Diagnosis not present

## 2020-03-19 DIAGNOSIS — I509 Heart failure, unspecified: Secondary | ICD-10-CM | POA: Diagnosis not present

## 2020-03-19 DIAGNOSIS — E039 Hypothyroidism, unspecified: Secondary | ICD-10-CM | POA: Diagnosis not present

## 2020-04-25 DIAGNOSIS — H40053 Ocular hypertension, bilateral: Secondary | ICD-10-CM | POA: Diagnosis not present

## 2020-04-25 DIAGNOSIS — H2512 Age-related nuclear cataract, left eye: Secondary | ICD-10-CM | POA: Diagnosis not present

## 2020-04-25 DIAGNOSIS — H43393 Other vitreous opacities, bilateral: Secondary | ICD-10-CM | POA: Diagnosis not present

## 2020-04-25 DIAGNOSIS — Z961 Presence of intraocular lens: Secondary | ICD-10-CM | POA: Diagnosis not present

## 2020-06-20 ENCOUNTER — Other Ambulatory Visit (HOSPITAL_COMMUNITY): Payer: Self-pay

## 2020-09-20 DIAGNOSIS — F418 Other specified anxiety disorders: Secondary | ICD-10-CM | POA: Diagnosis not present

## 2020-09-20 DIAGNOSIS — I10 Essential (primary) hypertension: Secondary | ICD-10-CM | POA: Diagnosis not present

## 2020-09-20 DIAGNOSIS — I509 Heart failure, unspecified: Secondary | ICD-10-CM | POA: Diagnosis not present

## 2020-09-20 DIAGNOSIS — E559 Vitamin D deficiency, unspecified: Secondary | ICD-10-CM | POA: Diagnosis not present

## 2020-09-20 DIAGNOSIS — G4733 Obstructive sleep apnea (adult) (pediatric): Secondary | ICD-10-CM | POA: Diagnosis not present

## 2020-09-20 DIAGNOSIS — Z0000 Encounter for general adult medical examination without abnormal findings: Secondary | ICD-10-CM | POA: Diagnosis not present

## 2020-09-20 DIAGNOSIS — R7303 Prediabetes: Secondary | ICD-10-CM | POA: Diagnosis not present

## 2020-09-20 DIAGNOSIS — E039 Hypothyroidism, unspecified: Secondary | ICD-10-CM | POA: Diagnosis not present

## 2020-09-27 ENCOUNTER — Other Ambulatory Visit: Payer: Self-pay

## 2020-09-27 ENCOUNTER — Other Ambulatory Visit (HOSPITAL_COMMUNITY): Payer: Self-pay

## 2020-09-27 ENCOUNTER — Encounter: Payer: Self-pay | Admitting: Cardiovascular Disease

## 2020-09-27 ENCOUNTER — Ambulatory Visit (INDEPENDENT_AMBULATORY_CARE_PROVIDER_SITE_OTHER): Payer: Medicare HMO | Admitting: Cardiovascular Disease

## 2020-09-27 VITALS — BP 118/74 | HR 75 | Resp 18 | Ht 66.0 in | Wt 263.2 lb

## 2020-09-27 DIAGNOSIS — I872 Venous insufficiency (chronic) (peripheral): Secondary | ICD-10-CM | POA: Diagnosis not present

## 2020-09-27 DIAGNOSIS — I50812 Chronic right heart failure: Secondary | ICD-10-CM | POA: Diagnosis not present

## 2020-09-27 DIAGNOSIS — Q2111 Secundum atrial septal defect: Secondary | ICD-10-CM

## 2020-09-27 DIAGNOSIS — Q211 Atrial septal defect: Secondary | ICD-10-CM

## 2020-09-27 DIAGNOSIS — G4733 Obstructive sleep apnea (adult) (pediatric): Secondary | ICD-10-CM | POA: Diagnosis not present

## 2020-09-27 DIAGNOSIS — I10 Essential (primary) hypertension: Secondary | ICD-10-CM | POA: Diagnosis not present

## 2020-09-27 DIAGNOSIS — E119 Type 2 diabetes mellitus without complications: Secondary | ICD-10-CM | POA: Diagnosis not present

## 2020-09-27 DIAGNOSIS — E7800 Pure hypercholesterolemia, unspecified: Secondary | ICD-10-CM

## 2020-09-27 MED ORDER — METOPROLOL SUCCINATE ER 25 MG PO TB24: 25.0000 mg | ORAL_TABLET | Freq: Every day | ORAL | 3 refills | Status: DC

## 2020-09-27 MED ORDER — METOPROLOL SUCCINATE ER 25 MG PO TB24
25.0000 mg | ORAL_TABLET | Freq: Every day | ORAL | 3 refills | Status: DC
  Filled 2020-09-27: qty 90, 90d supply, fill #0

## 2020-09-27 MED ORDER — FUROSEMIDE 40 MG PO TABS
40.0000 mg | ORAL_TABLET | Freq: Every day | ORAL | 6 refills | Status: DC | PRN
  Filled 2020-09-27: qty 30, 30d supply, fill #0

## 2020-09-27 MED ORDER — FUROSEMIDE 40 MG PO TABS: 40.0000 mg | ORAL_TABLET | Freq: Every day | ORAL | 6 refills | Status: DC | PRN

## 2020-09-27 NOTE — Patient Instructions (Addendum)
Medication Instructions:  FUROSEMIDE: Take one 40 mg tablet daily as needed for a weight over 260 pounds.  Dr Sallyanne Kuster recommends Repatha (PCSK9). This is an injectable cholesterol medication. This medication will need prior approval with your insurance company, which we will work on. If the medication is not approved initially, we may need to do an appeal with your insurance. We will keep you updated on this process. This medication can be provided at some local pharmacies or be shipped to you from a specialty pharmacy.    *If you need a refill on your cardiac medications before your next appointment, please call your pharmacy*   Lab Work: None ordered If you have labs (blood work) drawn today and your tests are completely normal, you will receive your results only by: [place] (if you have MyChart) OR A paper copy in the mail If you have any lab test that is abnormal or we need to change your treatment, we will call you to review the results.   Testing/Procedures: None ordered   Follow-Up: At [facility], you and your health needs are our priority.  As part of our continuing mission to provide you with exceptional heart care, we have created designated Provider Care Teams.  These Care Teams include your primary Cardiologist (physician) and Advanced Practice Providers (APPs -  Physician Assistants and Nurse Practitioners) who all work together to provide you with the care you need, when you need it.  We recommend signing up for the patient portal called "MyChart".  Sign up information is provided on this After Visit Summary.  MyChart is used to connect with patients for Virtual Visits (Telemedicine).  Patients are able to view lab/test results, encounter notes, upcoming appointments, etc.  Non-urgent messages can be sent to your provider as well.   To learn more about what you can do with MyChart, go to [email or web address].    Your next appointment:   12 month(s)  The  format for your next appointment:   In Person  Provider:   You may see Sanda Klein, MD or one of the following Advanced Practice Providers on your designated Care Team:   Almyra Deforest, PA-C Fabian Sharp, [place] or  Roby Lofts, [place]   Other Instructions Please use your CPAP nightly

## 2020-09-27 NOTE — Progress Notes (Signed)
Cardiology Office Note:    Date:  09/27/2020   ID:  Ernie Hew, DOB 1945/07/08, MRN [id number]  PCP:  Jani Gravel, MD  Cardiologist:  None  Electrophysiologist:  None   Referring MD: Jani Gravel, MD   Chief Complaint  Patient presents with   Congestive Heart Failure     History of Present Illness:    Amber Cook is a 75 y.o. female with a hx of ostium secundum atrial septal defect closed in 2008 after a failed percutaneous closure, morbid obesity, obstructive sleep apnea, mixed hyperlipidemia intolerant to statins, left ventricular diastolic dysfunction, mild pulmonary artery hypertension, peripheral venous insufficiency with history of ablation of the right greater saphenous vein, hypothyroidism and hyperparathyroidism.  She continues have problems with lower extremity edema and intermittently adjusts the dose of her diuretic to compensate for this.  She took an extra dose of furosemide yesterday and has lost 5 pounds overnight.  It seems to me that she is still a little hypervolemic today, when she weighs 263 pounds.  "Dry weight" appears to be around 260 pounds.  She has spotty compliance with CPAP.  She is not taking Repatha due to cost.  She did fine with this medication while she was receiving assistance, but has not been able to afford it for the last couple of years.  She denies dizziness, syncope, palpitations or any type of chest discomfort.  She has moderate exertional dyspnea, but is extremely sedentary.  Overall seems to be NYHA functional class II.  She reports that she had labs done last week, with the most recent labs I have from [facility] are from December 2021.  She did not have any coronary stenoses at her preoperative angiogram in 2008.  She has severely elevated total cholesterol and LDL cholesterol (usually LDL is greater than 200) but has been intolerant of numerous statins including pravastatin, atorvastatin, rosuvastatin and Livalo.   She also had side effects with Zetia.  All of these cause myalgia.  There was a plan to enroll her in the CLEAR trial of bempedoic acid, but these are dropped since follow-up cannot be arranged.  PCSK9 inhibitors were discussed with her several years ago, at that time the cost was very high.  She was able to take Repatha while she was receiving assistance, but this was dropped about a year and a half ago.   Her last echocardiogram in November 2016 showed normal left ventricular systolic function (EF 33-29%), grade 1 diastolic dysfunction, normal pulmonary artery pressure (although no calculation provided), normal right ventricular size and function and normal right atrial size, no mention of residual shunt.  A follow-up echocardiogram performed in August 2020 continues to show a mildly depressed right ventricle, but the right ventricular systolic pressure is only mildly elevated at 36 mmHg.  LV diastolic function parameters are consistent with impaired relaxation, but there is no evidence of elevated left atrial pressure.  Past Medical History:  Diagnosis Date   Arthritis    ASD (atrial septal defect), ostium secundum    surgical repair 7/08   Colitis    Depression    takes Lexapro daily   Diabetes mellitus without complication (HCC)    borderline   Diastolic dysfunction 08/08/82   grade 1   Elevated parathyroid hormone    GERD (gastroesophageal reflux disease)    but doesn't require meds   History of bronchitis    a month ago   Hypercalcemia    Hyperlipidemia    takes  Welchol bid    Hypertension    takes Metoprolol nightly    Hypothyroidism    takes Synthroid daily   Internal hemorrhoids    Peripheral neuropathy    Pneumonia    hx of->a yr ago   Sleep apnea    supposed to use CPAP;repeat study tonight @ [place]   Urinary frequency    takes Lasix daily   Vaginal yeast infection    Venous insufficiency    SVG ablation    Past Surgical History:  Procedure Laterality Date    2d echo with bubble study  03/31/12   atral septal defect repair  July 2009   after failed ASD closure   CARDIAC CATHETERIZATION  07/21/2006   Right & Left: normal coronary arteries, mod. pulmonary hypertension w/evidence of left to right shunting.   CERVICAL BIOPSY     COLONOSCOPY     left breast biopsy     NM MYOCAR PERF WALL MOTION  07/15/2006   high risk scan -   PARATHYROID EXPLORATION  04/05/2012   Procedure: PARATHYROID EXPLORATION;  Surgeon: Pedro Earls, MD;  Location: [facility];  Service: General;  Laterality: N/A;  Exploration of parathryroid and removal of left and right interior parathyroid   PARATHYROIDECTOMY     right vein ligation  2009   TONSILLECTOMY AND ADENOIDECTOMY     and adenoids at age 41   TRANSESOPHAGEAL ECHOCARDIOGRAM  08/13/2006   unsuccessful attempt at ASD closure    Current Medications: Current Meds  Medication Sig   aspirin 81 MG tablet Take 1 tablet (81 mg total) by mouth daily.   escitalopram (LEXAPRO) 20 MG tablet Take 1 tablet by mouth daily.   latanoprost (XALATAN) 0.005 % ophthalmic solution Place 1 drop into both eyes daily.   levothyroxine (SYNTHROID) 150 MCG tablet Take 150 mcg by mouth daily.   Melatonin 1 MG TABS Take by mouth.   MULTIPLE VITAMIN PO Take by mouth daily. Pt isn't sure of the dosage   potassium chloride (K-DUR,KLOR-CON) 10 MEQ tablet TAKE 1 TO 3 TABLETS BY MOUTH DAILY AS DIRECTED   Specialty Vitamins Products (MAGNESIUM, AMINO ACID CHELATE,) 133 MG tablet Take 1 tablet by mouth as needed. 400 mg prn   spironolactone (ALDACTONE) 25 MG tablet Take 2 tablets by mouth daily.   [DISCONTINUED] furosemide (LASIX) 80 MG tablet Take 40 mg by mouth daily.   [DISCONTINUED] metoprolol succinate (TOPROL-XL) 25 MG 24 hr tablet Take 1 tablet (25 mg total) by mouth daily.     Allergies:   Erythromycin, Lidocaine, Other, Prilosec [omeprazole], Statins, and Vicodin [hydrocodone-acetaminophen]   Social History   Socioeconomic History    Marital status: Single    Spouse name: Not on file   Number of children: Not on file   Years of education: Not on file   Highest education level: Not on file  Occupational History   Not on file  Tobacco Use   Smoking status: Never   Smokeless tobacco: Never  Substance and Sexual Activity   Alcohol use: No   Drug use: No   Sexual activity: Not on file  Other Topics Concern   Not on file  Social History Narrative   Not on file   Social Determinants of Health   Financial Resource Strain: Not on file  Food Insecurity: Not on file  Transportation Needs: Not on file  Physical Activity: Not on file  Stress: Not on file  Social Connections: Not on file     Family History:  The patient's family history includes Asthma in her sister; Cancer in her maternal grandfather, maternal uncle, and mother; Diabetes in her sister and sister; Heart attack in her maternal grandmother; Heart failure in her paternal grandfather; Hyperlipidemia in her sister and sister; Hypertension in her sister and sister; Kidney disease in her paternal grandfather; Pneumonia in her father.  ROS:   Please see the history of present illness.     All other systems reviewed and are negative.  EKGs/Labs/Other Studies Reviewed:    The following studies were reviewed today: Echocardiogram 11/18/2018  EKG:  EKG is  ordered today.  EKG is ordered today and shows sinus rhythm and generalized low voltage due to obesity, otherwise normal tracing  Recent Labs: 03/13/2020 Hemoglobin A1c 6.2% Creatinine 1.0 Normal liver function tests Recent Lipid Panel    Component Value Date/Time   CHOL 279 (H) 04/01/2012 1436   CHOL 282 (H) 04/01/2012 1436   TRIG 256 (H) 04/01/2012 1436   TRIG 267 (H) 04/01/2012 1436   HDL 44 04/01/2012 1436   HDL 45 04/01/2012 1436   CHOLHDL 6.3 04/01/2012 1436   CHOLHDL 6.3 04/01/2012 1436   VLDL 51 (H) 04/01/2012 1436   VLDL 53 (H) 04/01/2012 1436   LDLCALC 184 (H) 04/01/2012 1436    LDLCALC 184 (H) 04/01/2012 1436  08/26/2018 total cholesterol 263, HDL 41, LDL 185, triglycerides 185.   03/13/2020 total cholesterol 233, HDL 39, LDL 166, triglycerides 152  Physical Exam:    VS:  BP 118/74 (BP Location: Left Arm, Patient Position: Sitting, Cuff Size: Normal)   Pulse 75   Resp 18   Ht 5\' 6"  (1.676 m)   Wt 263 lb 3.2 oz (119.4 kg)   SpO2 97%   BMI 42.48 kg/m     Wt Readings from Last 3 Encounters:  09/27/20 263 lb 3.2 oz (119.4 kg)  12/29/18 250 lb (113.4 kg)  07/27/18 278 lb (126.1 kg)      General: Alert, oriented x3, no distress, morbidly obese Head: no evidence of trauma, PERRL, EOMI, no exophtalmos or lid lag, no myxedema, no xanthelasma; normal ears, nose and oropharynx Neck: normal jugular venous pulsations and no hepatojugular reflux; brisk carotid pulses without delay and no carotid bruits Chest: clear to auscultation, no signs of consolidation by percussion or palpation, normal fremitus, symmetrical and full respiratory excursions Cardiovascular: normal position and quality of the apical impulse, regular rhythm, normal first and second heart sounds, no murmurs, rubs or gallops Abdomen: no tenderness or distention, no masses by palpation, no abnormal pulsatility or arterial bruits, normal bowel sounds, no hepatosplenomegaly Extremities: no clubbing, cyanosis; mildly pitting 2+ edema on the left, 1+ on the right ankle; 2+ radial, ulnar and brachial pulses bilaterally; 2+ right femoral, posterior tibial and dorsalis pedis pulses; 2+ left femoral, posterior tibial and dorsalis pedis pulses; no subclavian or femoral bruits Neurological: grossly nonfocal Psych: Normal mood and affect   ASSESSMENT:    1. Chronic right-sided heart failure ([place])   2. OSA (obstructive sleep apnea)   3. Hypercholesterolemia   4. ASD (atrial septal defect), ostium secundum   5. Morbid obesity ([place])   6. Peripheral venous insufficiency   7. Essential hypertension     PLAN:     In order of problems listed above:  RHF: She has mostly symptoms of right heart failure, not much in the way of left heart failure.  As before, it is apparent that she has edema both due to right heart failure/hypervolemia and due to  peripheral venous insufficiency (she has well-documented venous reflux and has previously undergone unilateral saphenectomy).  We went over these 2 pathophysiological mechanisms in detail again today.  She should avoid prolonged orthostasis, keep her legs elevated as much as possible and wear compression stockings when standing up.  She should also limit sodium dietary intake and weigh herself on a daily basis.  I think 260 pounds is a reasonable estimation of her "dry weight".  She should take her furosemide if her weight is 260 pounds or higher.  I recommend continuing the spironolactone indefinitely. while there may be some component of fixed pulmonary arteriolar disease due to lifelong atrial septal defect, untreated obstructive sleep apnea is probably worsening the situation. OSA: Also went over the pathophysiology of sleep apnea in detail and recommended 100% compliance with CPAP.  She does have daytime hypersomnolence and fatigue. HLP: Intolerant to multiple statins and to ezetimibe and severely elevated LDL.  Fortunately without known CAD or PAD.  We will try to see if we can get approval for PCSK9 inhibitors again.  I suspect that she has heterozygous familial hypercholesterolemia. ASD: No evidence of residual shunt by recent echo. Morbid obesity: Contributing to sleep apnea and pulmonary artery hypertension. Peripheral venous insufficiency: Keep legs elevated or wear compression stockings throughout the day. HTN:  Controlled.   Medication Adjustments/Labs and Tests Ordered: Current medicines are reviewed at length with the patient today.  Concerns regarding medicines are outlined above.  Orders Placed This Encounter  Procedures   EKG 12-Lead    Meds  ordered this encounter  Medications   DISCONTD: metoprolol succinate (TOPROL-XL) 25 MG 24 hr tablet    Sig: Take 1 tablet (25 mg total) by mouth daily.    Dispense:  90 tablet    Refill:  3   DISCONTD: furosemide (LASIX) 40 MG tablet    Sig: Take 1 tablet (40 mg total) by mouth daily as needed (Take one tablet daily for a weight over 260 lbs).    Dispense:  30 tablet    Refill:  6    No refill needed. Please noted dose change   metoprolol succinate (TOPROL-XL) 25 MG 24 hr tablet    Sig: Take 1 tablet (25 mg total) by mouth daily.    Dispense:  90 tablet    Refill:  3   furosemide (LASIX) 40 MG tablet    Sig: Take 1 tablet (40 mg total) by mouth daily as needed (Take one tablet daily for a weight over 260 lbs).    Dispense:  30 tablet    Refill:  6    No refill needed. Please noted dose change     Patient Instructions  Medication Instructions:  FUROSEMIDE: Take one 40 mg tablet daily as needed for a weight over 260 pounds.  Dr Sallyanne Kuster recommends Repatha (PCSK9). This is an injectable cholesterol medication. This medication will need prior approval with your insurance company, which we will work on. If the medication is not approved initially, we may need to do an appeal with your insurance. We will keep you updated on this process. This medication can be provided at some local pharmacies or be shipped to you from a specialty pharmacy.    *If you need a refill on your cardiac medications before your next appointment, please call your pharmacy*   Lab Work: None ordered If you have labs (blood work) drawn today and your tests are completely normal, you will receive your results only by: [place] (if you  have MyChart) OR A paper copy in the mail If you have any lab test that is abnormal or we need to change your treatment, we will call you to review the results.   Testing/Procedures: None ordered   Follow-Up: At [facility], you and your health needs are our  priority.  As part of our continuing mission to provide you with exceptional heart care, we have created designated Provider Care Teams.  These Care Teams include your primary Cardiologist (physician) and Advanced Practice Providers (APPs -  Physician Assistants and Nurse Practitioners) who all work together to provide you with the care you need, when you need it.  We recommend signing up for the patient portal called "MyChart".  Sign up information is provided on this After Visit Summary.  MyChart is used to connect with patients for Virtual Visits (Telemedicine).  Patients are able to view lab/test results, encounter notes, upcoming appointments, etc.  Non-urgent messages can be sent to your provider as well.   To learn more about what you can do with MyChart, go to [email or web address].    Your next appointment:   12 month(s)  The format for your next appointment:   In Person  Provider:   You may see Sanda Klein, MD or one of the following Advanced Practice Providers on your designated Care Team:   Almyra Deforest, PA-C Fabian Sharp, [place] or  Roby Lofts, [place]   Other Instructions Please use your CPAP nightly   Signed, Sanda Klein, MD  09/27/2020 4:36 PM    [place]

## 2020-10-07 ENCOUNTER — Other Ambulatory Visit (HOSPITAL_COMMUNITY): Payer: Self-pay

## 2020-10-18 ENCOUNTER — Ambulatory Visit: Payer: Medicare HMO

## 2020-10-24 DIAGNOSIS — Z961 Presence of intraocular lens: Secondary | ICD-10-CM | POA: Diagnosis not present

## 2020-10-24 DIAGNOSIS — H40053 Ocular hypertension, bilateral: Secondary | ICD-10-CM | POA: Diagnosis not present

## 2020-10-24 DIAGNOSIS — H43393 Other vitreous opacities, bilateral: Secondary | ICD-10-CM | POA: Diagnosis not present

## 2020-10-24 DIAGNOSIS — H2512 Age-related nuclear cataract, left eye: Secondary | ICD-10-CM | POA: Diagnosis not present

## 2021-01-30 ENCOUNTER — Telehealth: Payer: Self-pay | Admitting: *Deleted

## 2021-01-30 NOTE — Telephone Encounter (Signed)
Called the patient to let her know that her handicapped placard has been signed. She asked that this be mailed.   Placed in the mail for tomorrow

## 2021-03-31 DIAGNOSIS — Z0000 Encounter for general adult medical examination without abnormal findings: Secondary | ICD-10-CM | POA: Diagnosis not present

## 2021-03-31 DIAGNOSIS — R7303 Prediabetes: Secondary | ICD-10-CM | POA: Diagnosis not present

## 2021-04-03 DIAGNOSIS — E7800 Pure hypercholesterolemia, unspecified: Secondary | ICD-10-CM | POA: Diagnosis not present

## 2021-04-03 DIAGNOSIS — I509 Heart failure, unspecified: Secondary | ICD-10-CM | POA: Diagnosis not present

## 2021-04-03 DIAGNOSIS — R7303 Prediabetes: Secondary | ICD-10-CM | POA: Diagnosis not present

## 2021-04-03 DIAGNOSIS — I10 Essential (primary) hypertension: Secondary | ICD-10-CM | POA: Diagnosis not present

## 2021-04-03 DIAGNOSIS — J069 Acute upper respiratory infection, unspecified: Secondary | ICD-10-CM | POA: Diagnosis not present

## 2021-04-03 DIAGNOSIS — E039 Hypothyroidism, unspecified: Secondary | ICD-10-CM | POA: Diagnosis not present

## 2021-04-30 ENCOUNTER — Other Ambulatory Visit: Payer: Self-pay

## 2021-04-30 ENCOUNTER — Encounter (HOSPITAL_BASED_OUTPATIENT_CLINIC_OR_DEPARTMENT_OTHER): Payer: Self-pay

## 2021-04-30 ENCOUNTER — Emergency Department (HOSPITAL_BASED_OUTPATIENT_CLINIC_OR_DEPARTMENT_OTHER)
Admission: EM | Admit: 2021-04-30 | Discharge: 2021-04-30 | Disposition: A | Discharge status: Home / Self Care | Payer: Medicare HMO | Attending: Emergency Medicine | Admitting: Emergency Medicine

## 2021-04-30 DIAGNOSIS — R0981 Nasal congestion: Secondary | ICD-10-CM | POA: Diagnosis not present

## 2021-04-30 DIAGNOSIS — Z79899 Other long term (current) drug therapy: Secondary | ICD-10-CM | POA: Insufficient documentation

## 2021-04-30 DIAGNOSIS — Z6841 Body Mass Index (BMI) 40.0 and over, adult: Secondary | ICD-10-CM | POA: Diagnosis not present

## 2021-04-30 DIAGNOSIS — R040 Epistaxis: Secondary | ICD-10-CM | POA: Diagnosis not present

## 2021-04-30 MED ORDER — SALINE SPRAY 0.65 % NA SOLN: 1.0000 | NASAL | 0 refills | Status: AC | PRN

## 2021-04-30 NOTE — Discharge Instructions (Addendum)
Use a humidifier and saline nasal spray as discussed. Follow-up with your primary care provider.

## 2021-04-30 NOTE — ED Triage Notes (Signed)
Pt c/o intermittent nosebleeds x 5 days-none at present-NAD-steady gait

## 2021-04-30 NOTE — ED Provider Notes (Signed)
[facility] EMERGENCY DEPARTMENT Provider Note   CSN: [id number] Arrival date & time: 04/30/21  1448     History  Chief Complaint  Patient presents with   Epistaxis    Amber Cook is a 76 y.o. female.  Patient with sporadic nose bleed from left nare, onset 5 days ago. No trauma. URI symptoms with runny nose. Environment at home is dry. Low dose aspirin daily. No current epistaxis.  The history is provided by the patient. No language interpreter was used.  Epistaxis Location:  L nare Severity:  Mild Duration:  5 days Timing:  Sporadic Progression:  Waxing and waning Chronicity:  New Context: aspirin use   Context: not anticoagulants, not bleeding disorder, not home oxygen and not hypertension   Relieved by:  Applying pressure Associated symptoms: congestion   Associated symptoms: no blood in oropharynx, no cough, no facial pain, no fever, no headaches, no sinus pain and no sore throat       Home Medications Prior to Admission medications   Medication Sig Start Date End Date Taking? Authorizing Provider  aspirin 81 MG tablet Take 1 tablet (81 mg total) by mouth daily. 07/27/18   Croitoru, Mihai, MD  escitalopram (LEXAPRO) 20 MG tablet Take 1 tablet by mouth daily. 01/12/14   [provider]  furosemide (LASIX) 40 MG tablet Take 1 tablet (40 mg total) by mouth daily as needed (Take one tablet daily for a weight over 260 lbs). 09/27/20   Croitoru, Mihai, MD  latanoprost (XALATAN) 0.005 % ophthalmic solution Place 1 drop into both eyes daily. 01/15/15   [provider]  levothyroxine (SYNTHROID) 150 MCG tablet Take 150 mcg by mouth daily.    [provider]  Melatonin 1 MG TABS Take by mouth.    [provider]  metoprolol succinate (TOPROL-XL) 25 MG 24 hr tablet Take 1 tablet (25 mg total) by mouth daily. 09/27/20   Croitoru, Mihai, MD  MULTIPLE VITAMIN PO Take by mouth daily. Pt isn't sure of the dosage    [provider]   potassium chloride (K-DUR,KLOR-CON) 10 MEQ tablet TAKE 1 TO 3 TABLETS BY MOUTH DAILY AS DIRECTED 12/04/14   Lorretta Harp, MD  Specialty Vitamins Products (MAGNESIUM, AMINO ACID CHELATE,) 133 MG tablet Take 1 tablet by mouth as needed. 400 mg prn    [provider]  spironolactone (ALDACTONE) 25 MG tablet Take 2 tablets by mouth daily. 01/07/15   [provider]  niacin (NIASPAN) 1000 MG CR tablet  08/05/12 06/08/13  [provider]      Allergies    Erythromycin, Codeine, Lidocaine, Other, Prilosec [omeprazole], and Statins    Review of Systems   Review of Systems  Constitutional:  Negative for fever.  HENT:  Positive for congestion and nosebleeds. Negative for sinus pain and sore throat.   Respiratory:  Negative for cough.   Neurological:  Negative for headaches.  All other systems reviewed and are negative.  Physical Exam Updated Vital Signs BP (!) 130/59 (BP Location: Left Arm)    Pulse 87    Temp 98.3 F (36.8 C) (Oral)    Resp 20    Ht 5\' 6"  (1.676 m)    Wt 125.6 kg    SpO2 95%    BMI 44.71 kg/m  Physical Exam Constitutional:      Appearance: Normal appearance.  HENT:     Head: Normocephalic and atraumatic.     Nose: Congestion and rhinorrhea present. No nasal deformity,  signs of injury, nasal tenderness or mucosal edema.     Right Nostril: No septal hematoma.     Left Nostril: No septal hematoma.  Eyes:     Extraocular Movements: Extraocular movements intact.  Cardiovascular:     Rate and Rhythm: Normal rate.  Pulmonary:     Effort: Pulmonary effort is normal.  Musculoskeletal:        General: Normal range of motion.  Skin:    General: Skin is warm and dry.  Neurological:     Mental Status: She is alert and oriented to person, place, and time.  Psychiatric:        Mood and Affect: Mood normal.        Behavior: Behavior normal.    ED Results / Procedures / Treatments   Labs (all labs ordered are listed, but only abnormal results are  displayed) Labs Reviewed - No data to display  EKG None  Radiology No results found.  Procedures Procedures    Medications Ordered in ED Medications - No data to display  ED Course/ Medical Decision Making/ A&P                           Medical Decision Making  Patient presents with likely anterior epistaxis, currently resolved. No risk factors for bleeding disorders. Patient hemodynamically stable. No evidence of anemia. Patient discharged with care instructions, including humidifier in home and nasal saline. Return precautions discussed. Follow up with PCP. Patient appears safe for discharge at this time.        Final Clinical Impression(s) / ED Diagnoses Final diagnoses:  Epistaxis, recurrent    Rx / DC Orders ED Discharge Orders     None         Etta Quill, NP 04/30/21 1821    Orpah Greek, MD 05/02/21 [phone]

## 2021-05-02 ENCOUNTER — Encounter (HOSPITAL_BASED_OUTPATIENT_CLINIC_OR_DEPARTMENT_OTHER): Payer: Self-pay | Admitting: Emergency Medicine

## 2021-05-02 ENCOUNTER — Emergency Department (HOSPITAL_BASED_OUTPATIENT_CLINIC_OR_DEPARTMENT_OTHER)
Admission: EM | Admit: 2021-05-02 | Discharge: 2021-05-02 | Disposition: A | Discharge status: Home / Self Care | Payer: Medicare HMO | Attending: Emergency Medicine | Admitting: Emergency Medicine

## 2021-05-02 DIAGNOSIS — Z7982 Long term (current) use of aspirin: Secondary | ICD-10-CM | POA: Insufficient documentation

## 2021-05-02 DIAGNOSIS — E039 Hypothyroidism, unspecified: Secondary | ICD-10-CM | POA: Diagnosis not present

## 2021-05-02 DIAGNOSIS — I10 Essential (primary) hypertension: Secondary | ICD-10-CM | POA: Diagnosis not present

## 2021-05-02 DIAGNOSIS — Z79899 Other long term (current) drug therapy: Secondary | ICD-10-CM | POA: Diagnosis not present

## 2021-05-02 DIAGNOSIS — R040 Epistaxis: Secondary | ICD-10-CM | POA: Diagnosis not present

## 2021-05-02 DIAGNOSIS — E1140 Type 2 diabetes mellitus with diabetic neuropathy, unspecified: Secondary | ICD-10-CM | POA: Insufficient documentation

## 2021-05-02 MED ORDER — OXYMETAZOLINE HCL 0.05 % NA SOLN
4.0000 | Freq: Once | NASAL | Status: AC
  Administered 2021-05-02: 4 via NASAL
  Filled 2021-05-02: qty 30

## 2021-05-02 NOTE — ED Notes (Signed)
No active bleeding at this time. 

## 2021-05-02 NOTE — ED Triage Notes (Signed)
Bleeding from nose x 30 min. Takes 81 mg asa daily

## 2021-05-02 NOTE — ED Provider Notes (Signed)
[facility] Provider Note: Georgena Spurling, MD, FACEP  CSN: [id number] MRN: [id number] ARRIVAL: 05/02/21 at [place]: [place]  Epistaxis   HISTORY OF PRESENT ILLNESS  05/02/21 3:23 AM KRISSIE MERRICK is a 76 y.o. female with a 7-day history of intermittent nosebleeds.  She was seen in the ED for this 2 days ago.  She was treated with nasal saline.  She is on 81 mg of aspirin daily but no other anticoagulants.  She returns with bleeding from her right naris that began about 30 minutes prior to arrival.  It was fairly heavy at times.  She was able to get the bleeding to stop by applying pressure to her upper nose.   Past Medical History:  Diagnosis Date   Arthritis    ASD (atrial septal defect), ostium secundum    surgical repair 7/08   Colitis    Depression    takes Lexapro daily   Diabetes mellitus without complication (HCC)    borderline   Diastolic dysfunction 06/21/00   grade 1   Elevated parathyroid hormone    GERD (gastroesophageal reflux disease)    but doesn't require meds   History of bronchitis    a month ago   Hypercalcemia    Hyperlipidemia    takes Welchol bid    Hypertension    takes Metoprolol nightly    Hypothyroidism    takes Synthroid daily   Internal hemorrhoids    Peripheral neuropathy    Pneumonia    hx of->a yr ago   Sleep apnea    supposed to use CPAP;repeat study tonight @ [facility]   Urinary frequency    takes Lasix daily   Vaginal yeast infection    Venous insufficiency    SVG ablation    Past Surgical History:  Procedure Laterality Date   2d echo with bubble study  03/31/12   atral septal defect repair  July 2009   after failed ASD closure   CARDIAC CATHETERIZATION  07/21/2006   Right & Left: normal coronary arteries, mod. pulmonary hypertension w/evidence of left to right shunting.   CERVICAL BIOPSY     COLONOSCOPY     left breast biopsy     NM MYOCAR PERF WALL MOTION  07/15/2006   high risk  scan -   PARATHYROID EXPLORATION  04/05/2012   Procedure: PARATHYROID EXPLORATION;  Surgeon: Pedro Earls, MD;  Location: [facility];  Service: General;  Laterality: N/A;  Exploration of parathryroid and removal of left and right interior parathyroid   PARATHYROIDECTOMY     right vein ligation  2009   TONSILLECTOMY AND ADENOIDECTOMY     and adenoids at age 24   TRANSESOPHAGEAL ECHOCARDIOGRAM  08/13/2006   unsuccessful attempt at ASD closure    Family History  Problem Relation Age of Onset   Cancer Mother        leukemia   Pneumonia Father    Heart attack Maternal Grandmother    Cancer Maternal Grandfather        stomach   Heart failure Paternal Grandfather    Kidney disease Paternal Grandfather    Cancer Maternal Uncle        leukemia   Diabetes Sister    Hypertension Sister    Hyperlipidemia Sister    Asthma Sister    Diabetes Sister    Hyperlipidemia Sister    Hypertension Sister     Social History   Tobacco Use   Smoking status:  Never   Smokeless tobacco: Never  Vaping Use   Vaping Use: Never used  Substance Use Topics   Alcohol use: No   Drug use: No    Prior to Admission medications   Medication Sig Start Date End Date Taking? Authorizing Provider  aspirin 81 MG tablet Take 1 tablet (81 mg total) by mouth daily. 07/27/18   Croitoru, Mihai, MD  escitalopram (LEXAPRO) 20 MG tablet Take 1 tablet by mouth daily. 01/12/14   [provider]  furosemide (LASIX) 40 MG tablet Take 1 tablet (40 mg total) by mouth daily as needed (Take one tablet daily for a weight over 260 lbs). 09/27/20   Croitoru, Mihai, MD  latanoprost (XALATAN) 0.005 % ophthalmic solution Place 1 drop into both eyes daily. 01/15/15   [provider]  levothyroxine (SYNTHROID) 150 MCG tablet Take 150 mcg by mouth daily.    [provider]  Melatonin 1 MG TABS Take by mouth.    [provider]  metoprolol succinate (TOPROL-XL) 25 MG 24 hr tablet Take 1 tablet (25 mg total)  by mouth daily. 09/27/20   Croitoru, Mihai, MD  MULTIPLE VITAMIN PO Take by mouth daily. Pt isn't sure of the dosage    [provider]  potassium chloride (K-DUR,KLOR-CON) 10 MEQ tablet TAKE 1 TO 3 TABLETS BY MOUTH DAILY AS DIRECTED 12/04/14   Lorretta Harp, MD  sodium chloride (OCEAN) 0.65 % SOLN nasal spray Place 1 spray into both nostrils as needed for congestion. 04/30/21   Etta Quill, NP  Specialty Vitamins Products (MAGNESIUM, AMINO ACID CHELATE,) 133 MG tablet Take 1 tablet by mouth as needed. 400 mg prn    [provider]  spironolactone (ALDACTONE) 25 MG tablet Take 2 tablets by mouth daily. 01/07/15   [provider]  niacin (NIASPAN) 1000 MG CR tablet  08/05/12 06/08/13  [provider]    Allergies Erythromycin, Codeine, Lidocaine, Other, Prilosec [omeprazole], and Statins   REVIEW OF SYSTEMS  Negative except as noted here or in the History of Present Illness.   PHYSICAL EXAMINATION  Initial Vital Signs Blood pressure (!) 180/114, pulse 76, temperature (!) 96.6 F (35.9 C), temperature source Tympanic, resp. rate 20, height 5\' 6"  (1.676 m), weight 125.6 kg, SpO2 97 %.  Examination General: Well-developed, well-nourished female in no acute distress; appearance consistent with age of record HENT: normocephalic; atraumatic; no active bleeding seen in the anterior right naris; site of bleeding may be more posterior than is visible using a regular nasal speculum. Eyes: pupils equal, round and reactive to light; extraocular muscles intact Neck: supple Heart: regular rate and rhythm Lungs: clear to auscultation bilaterally Abdomen: soft; nondistended; nontender; bowel sounds present Extremities: No deformity; full range of motion; pulses normal Neurologic: Awake, alert and oriented; motor function intact in all extremities and symmetric; no facial droop Skin: Warm and dry Psychiatric: Normal mood and affect   RESULTS  Summary of this  visit's results, reviewed and interpreted by myself:   EKG Interpretation  Date/Time:    Ventricular Rate:    PR Interval:    QRS Duration:   QT Interval:    QTC Calculation:   R Axis:     Text Interpretation:         Laboratory Studies: No results found for this or any previous visit (from the past 24 hour(s)). Imaging Studies: No results found.  ED COURSE and MDM  Nursing notes, initial and subsequent vitals signs, including pulse oximetry, reviewed and interpreted  by myself.  Vitals:   05/02/21 0314 05/02/21 0315  BP:  (!) 180/114  Pulse:  76  Resp:  20  Temp:  (!) 96.6 F (35.9 C)  TempSrc:  Tympanic  SpO2:  97%  Weight: 125.6 kg   Height: 5\' 6"  (1.676 m)    Medications  oxymetazoline (AFRIN) 0.05 % nasal spray 4 spray (4 sprays Right Nare Given 05/02/21 0333)   Patient hemostatic after Afrin instillation.  Patient advised how to drip Afrin into her naris should she experience a recurrence.  We will refer to ENT for further evaluation as I am not able to see the site of the bleeding.  This may require fiberoptic rhinoscopy.Marland Kitchen   PROCEDURES  Procedures   ED DIAGNOSES     ICD-10-CM   1. Right-sided epistaxis  R04.0          Tinisha Etzkorn, Jenny Reichmann, MD 05/02/21 0430

## 2021-05-16 DIAGNOSIS — R040 Epistaxis: Secondary | ICD-10-CM | POA: Diagnosis not present

## 2021-06-18 DIAGNOSIS — R040 Epistaxis: Secondary | ICD-10-CM | POA: Diagnosis not present

## 2021-06-26 DIAGNOSIS — H43393 Other vitreous opacities, bilateral: Secondary | ICD-10-CM | POA: Diagnosis not present

## 2021-06-26 DIAGNOSIS — H2512 Age-related nuclear cataract, left eye: Secondary | ICD-10-CM | POA: Diagnosis not present

## 2021-06-26 DIAGNOSIS — Z961 Presence of intraocular lens: Secondary | ICD-10-CM | POA: Diagnosis not present

## 2021-06-26 DIAGNOSIS — H40053 Ocular hypertension, bilateral: Secondary | ICD-10-CM | POA: Diagnosis not present

## 2021-08-20 ENCOUNTER — Telehealth: Payer: Self-pay | Admitting: Registered Nurse

## 2021-08-20 NOTE — Telephone Encounter (Signed)
Spoke with patient, Ms Cohoon was calling in regards of another patient  Patient informed Dr Jerline Pain not in the clinic today information was send to him on patient chart

## 2021-08-20 NOTE — Telephone Encounter (Signed)
Pt is requesting a call from Dr Jerline Pain. She would not give me any information, just stating she wants to talk to him.

## 2021-08-28 DIAGNOSIS — R5383 Other fatigue: Secondary | ICD-10-CM | POA: Diagnosis not present

## 2021-08-28 DIAGNOSIS — Z6841 Body Mass Index (BMI) 40.0 and over, adult: Secondary | ICD-10-CM | POA: Diagnosis not present

## 2021-08-28 DIAGNOSIS — R600 Localized edema: Secondary | ICD-10-CM | POA: Diagnosis not present

## 2021-08-28 DIAGNOSIS — R69 Illness, unspecified: Secondary | ICD-10-CM | POA: Diagnosis not present

## 2021-08-28 DIAGNOSIS — I509 Heart failure, unspecified: Secondary | ICD-10-CM | POA: Diagnosis not present

## 2021-08-28 DIAGNOSIS — R7303 Prediabetes: Secondary | ICD-10-CM | POA: Diagnosis not present

## 2021-08-28 DIAGNOSIS — G4733 Obstructive sleep apnea (adult) (pediatric): Secondary | ICD-10-CM | POA: Diagnosis not present

## 2021-08-28 DIAGNOSIS — E559 Vitamin D deficiency, unspecified: Secondary | ICD-10-CM | POA: Diagnosis not present

## 2021-09-10 NOTE — Progress Notes (Signed)
Cardiology Office Note:    Date:  09/16/2021   ID:  Amber Cook, DOB 01-11-1946, MRN [id number]  PCP:  Fatima Sanger, FNP  Cardiologist:  Thurmon Fair, MD  Electrophysiologist:  None   Referring MD: Fatima Sanger, FNP   Chief Complaint: routine follow-up of right heart failure   History of Present Illness:    Amber Cook is a 76 y.o. female with a history of normal coronaries on cardiac catheterization in 2008, ostium secundum atrial septal defect s/p patch closure with autologous pericardium in 09/2006 after a failed percutaneous closure, chronic right sided heart failure, hypertension, hyperlipidemia intolerant to statins and Zetia, chronic venous insufficiency s/p ablation of the right greater saphenous vein, obstructive sleep apnea on CPAP, hypothyroidism, hyperparathyroidism, and morbid obesity who is followed by Dr. Royann Shivers and presents today for routine follow-up.   Most recent Echo in 10/2018 showed LVEF of 55-60% with mild asymmetric LVH and grade 1 diastolic dysfunction but no evidence of impaired relaxation as well as normal RV with mildly reduced systolic function and RVSP of 36 mmHg. No evidence of any residual ASD.   Patient was last seen by Dr. Royann Shivers in 09/2020 at which time she reported moderate exertion dyspnea but was extremely sedentary. Patient's chronic lower extremity edema was felt to be due to both right heart failure and peripheral venous insufficiency. She was prescribed Lasix to take if weight goes above 260 lbs and was advised to elevate legs as much as possible, wear compression stockings, and limit sodium intake.   Patient presents today for follow-up.  Here alone. Patient reports she was doing well until recently when she started having worsening lower extremity edema about 3 to 4 weeks ago. Her left leg is chronically larger than the right (she has had prior ablation in the right) but even her right leg has significantly swelling now.  She states her legs, especially the right one, are tender to the touch because they are so swollen.  She has also notes some weight gain.  Her weight is up about 4 pounds since 04/2021 but up almost 20 pounds since her visit last year with Dr. Royann Shivers in 09/2020. She has PRN Lasix 40mg  to take as needed but states last week she took 80 mg twice due to significant swelling and she did seem to have better urine output with this. She denies any shortness of breath, orthopnea, or PND.  She admits to eating a high sodium diet and states she eats fast food a lot.  We discussed the importance of limiting her salt intake.  She denies any chest pain.  She does report some cramping in her right flank as well and as her hands when she takes Lasix 80 mg but it helps if she takes a magnesium and potassium supplement with this. No palpitations. She notes occasional dizziness if she stands up too quickly first thing in the morning. No syncope.    Past Medical History:  Diagnosis Date   Arthritis    ASD (atrial septal defect), ostium secundum    surgical repair 7/08   Chronic right-sided CHF (congestive heart failure) (HCC)    Colitis    Depression    takes Lexapro daily   Diabetes mellitus without complication (HCC)    borderline   Diastolic dysfunction 03/31/2012   grade 1   Elevated parathyroid hormone    GERD (gastroesophageal reflux disease)    but doesn't require meds   History of bronchitis  a month ago   Hypercalcemia    Hyperlipidemia    takes Welchol bid    Hypertension    takes Metoprolol nightly    Hypothyroidism    takes Synthroid daily   Internal hemorrhoids    Peripheral neuropathy    Pneumonia    hx of->a yr ago   Sleep apnea    supposed to use CPAP;repeat study tonight @ [facility]   Urinary frequency    takes Lasix daily   Vaginal yeast infection    Venous insufficiency    SVG ablation    Past Surgical History:  Procedure Laterality Date   2d echo with bubble study   03/31/12   atral septal defect repair  July 2009   after failed ASD closure   CARDIAC CATHETERIZATION  07/21/2006   Right & Left: normal coronary arteries, mod. pulmonary hypertension w/evidence of left to right shunting.   CERVICAL BIOPSY     COLONOSCOPY     left breast biopsy     NM MYOCAR PERF WALL MOTION  07/15/2006   high risk scan -   PARATHYROID EXPLORATION  04/05/2012   Procedure: PARATHYROID EXPLORATION;  Surgeon: Valarie Merino, MD;  Location: [facility] OR;  Service: General;  Laterality: N/A;  Exploration of parathryroid and removal of left and right interior parathyroid   PARATHYROIDECTOMY     right vein ligation  2009   TONSILLECTOMY AND ADENOIDECTOMY     and adenoids at age 68   TRANSESOPHAGEAL ECHOCARDIOGRAM  08/13/2006   unsuccessful attempt at ASD closure    Current Medications: Current Meds  Medication Sig   aspirin 81 MG tablet Take 1 tablet (81 mg total) by mouth daily.   escitalopram (LEXAPRO) 20 MG tablet Take 1 tablet by mouth daily.   latanoprost (XALATAN) 0.005 % ophthalmic solution Place 1 drop into both eyes daily.   levothyroxine (SYNTHROID) 150 MCG tablet Take 150 mcg by mouth daily.   Melatonin 1 MG TABS Take by mouth.   metoprolol succinate (TOPROL-XL) 25 MG 24 hr tablet Take 1 tablet (25 mg total) by mouth daily.   MULTIPLE VITAMIN PO Take by mouth daily. Pt isn't sure of the dosage   potassium chloride (KLOR-CON) 10 MEQ tablet Take 2 tablets (20 mEq total) by mouth daily.   sodium chloride (OCEAN) 0.65 % SOLN nasal spray Place 1 spray into both nostrils as needed for congestion.   Specialty Vitamins Products (MAGNESIUM, AMINO ACID CHELATE,) 133 MG tablet Take 1 tablet by mouth as needed. 400 mg prn   spironolactone (ALDACTONE) 25 MG tablet Take 2 tablets by mouth daily.   [DISCONTINUED] furosemide (LASIX) 40 MG tablet Take 1 tablet (40 mg total) by mouth daily as needed (Take one tablet daily for a weight over 260 lbs).   [DISCONTINUED] potassium chloride  (K-DUR,KLOR-CON) 10 MEQ tablet TAKE 1 TO 3 TABLETS BY MOUTH DAILY AS DIRECTED   [DISCONTINUED] potassium chloride SA (KLOR-CON M20) 20 MEQ tablet Take 1 tablet (20 mEq total) by mouth daily. Can Take Additional Tablet When Taking Additional Lasix     Allergies:   Erythromycin, Codeine, Lidocaine, Other, Prilosec [omeprazole], and Statins   Social History   Socioeconomic History   Marital status: Single    Spouse name: Not on file   Number of children: Not on file   Years of education: Not on file   Highest education level: Not on file  Occupational History   Not on file  Tobacco Use   Smoking status: Never  Smokeless tobacco: Never  Vaping Use   Vaping Use: Never used  Substance and Sexual Activity   Alcohol use: No   Drug use: No   Sexual activity: Not on file  Other Topics Concern   Not on file  Social History Narrative   Not on file   Social Determinants of Health   Financial Resource Strain: Not on file  Food Insecurity: Not on file  Transportation Needs: Not on file  Physical Activity: Not on file  Stress: Not on file  Social Connections: Not on file     Family History: The patient's family history includes Asthma in her sister; Cancer in her maternal grandfather, maternal uncle, and mother; Diabetes in her sister and sister; Heart attack in her maternal grandmother; Heart failure in her paternal grandfather; Hyperlipidemia in her sister and sister; Hypertension in her sister and sister; Kidney disease in her paternal grandfather; Pneumonia in her father.  ROS:   Please see the history of present illness.     EKGs/Labs/Other Studies Reviewed:    The following studies were reviewed:  Echocardiogram 11/18/2018: Impressions:  1. The left ventricle has normal systolic function, with an ejection  fraction of 55-60%. The cavity size was normal. There is mild asymmetric  left ventricular hypertrophy. Left ventricular diastolic Doppler  parameters are consistent  with impaired  relaxation. No evidence of left ventricular regional wall motion  abnormalities.   2. The right ventricle has mildly reduced systolic function. The cavity  was normal. There is no increase in right ventricular wall thickness.  Right ventricular systolic pressure is normal with an estimated pressure  of 36.0 mmHg.   3. The aorta is normal unless otherwise noted.   EKG:  EKG ordered today. EKG personally reviewed and demonstrates normal sinus rhythm, rate 66 bpm, with low voltage QRS and mild T waves inversions in leads I and aVL. Normal axis. Normal PR and QRS intervals. QTc 413 ms.  Recent Labs: No results found for requested labs within last 365 days.  Recent Lipid Panel    Component Value Date/Time   CHOL 279 (H) 04/01/2012 1436   CHOL 282 (H) 04/01/2012 1436   TRIG 256 (H) 04/01/2012 1436   TRIG 267 (H) 04/01/2012 1436   HDL 44 04/01/2012 1436   HDL 45 04/01/2012 1436   CHOLHDL 6.3 04/01/2012 1436   CHOLHDL 6.3 04/01/2012 1436   VLDL 51 (H) 04/01/2012 1436   VLDL 53 (H) 04/01/2012 1436   LDLCALC 184 (H) 04/01/2012 1436   LDLCALC 184 (H) 04/01/2012 1436    Physical Exam:    Vital Signs: BP 134/72   Pulse 66   Ht 5\' 6"  (1.676 m)   Wt 281 lb 3.2 oz (127.6 kg)   SpO2 95%   BMI 45.39 kg/m     Wt Readings from Last 3 Encounters:  09/16/21 281 lb 3.2 oz (127.6 kg)  05/02/21 277 lb (125.6 kg)  04/30/21 277 lb (125.6 kg)     General: 76 y.o. morbidly obese Caucasian female in no acute distress. HEENT: Normocephalic and atraumatic. Sclera clear.  Neck: Supple. No carotid bruits. No JVD. Heart: RRR. Distinct S1 and S2. No murmurs, gallops, or rubs. Radial pulses 2+ and equal bilaterally. Lungs: No increased work of breathing. Clear to ausculation bilaterally. No wheezes, rhonchi, or rales.  Abdomen: Soft, non-distended, and non-tender to palpation.  Extremities: 2-3+ pitting edema of bilateral lower extremities (left chronically larger than right).   Skin:  Warm and dry. Neuro: Alert and  oriented x3. No focal deficits. Psych: Normal affect. Responds appropriately.  Assessment:    1. Acute on chronic right-sided CHF (congestive heart failure) (HCC)   2. ASD s/p closure   3. Primary hypertension   4. Hyperlipidemia, unspecified hyperlipidemia type   5. Chronic venous insufficiency   6. Obstructive sleep apnea     Plan:    Possible Acute on Chronic Right Heart Failure Last Echo in 10/2018 showed LVEF of 55-60% with mild asymmetric LVH and grade 1 diastolic dysfunction but no evidence of impaired relaxation as well as normal RV with mildly reduced systolic function and RVSP of 36 mmHg. No evidence of any residual ASD.  - She has chronic lower extremity edema but reports worsening lower extremity over the past 3-4 weeks. Her weight is up about 4 pounds since 04/2021 but up almost 20 pounds since 09/2020. She denies any shortness of breath and lungs are clear. - Will repeat Echo. - Will check BNP, BMET, Magnesium today.  - Currently takes Lasix 40mg  PRN for weight gain and lower extremity swelling. Recommended increasing to 40mg  daily and can take an additional 40mg  as needed for weight gain or worsening lower extremity edema. She will take KCl 20 mEq daily with this and an additional 20 mEq if she needs an additional dose of Lasix. - Continue Spironolactone 50mg  daily. - Discussed the importance of daily weights and sodium/fluid restrictions. She reports to eating a lot of fast food so discussed the importance of limiting this and limiting sodium intake to 000mg  per day. Salty Six sheet was given.  Ostium Secundum Atrial Septal Defect s/p Closure in 2008 S/p patch closure with autologous pericardium in 09/2006 after failed percutaneous closure. No evidence of residual ASD on last Echo in 2020. - Will repeat Echo as above.  Hypertension BP reasonably well controlled.  - Continue current medications: Spironolactone 50mg  daily and Toprol-XL 25mg   daily.   Hyperlipidemia Most recent lipid panel in 03/2021 at PCP's office: Total Cholesterol 233, Triglycerides 97, HDL 49, LDL 165.  - Intolerant to multiple statins in past as well as Zetia. Previously did well on Repatha but her patient assistance but stopped a couple of years ago.  - Did not have time to discuss today given acute needs above.  Chronic Venous Insufficiency S/p ablation of the right greater saphenous vein. She reports worsening lower extremity edema as above. - Continue Lasix as above. - Continue elevating legs as much as possible, wearing compression stockings, and limiting sodium intake.   Obstructive Sleep Apnea - Continue CPAP.  Disposition: Follow up in 1 month after Echo.    Medication Adjustments/Labs and Tests Ordered: Current medicines are reviewed at length with the patient today.  Concerns regarding medicines are outlined above.  Orders Placed This Encounter  Procedures   Basic metabolic panel   Brain natriuretic peptide   Magnesium   EKG 12-Lead   ECHOCARDIOGRAM COMPLETE   Meds ordered this encounter  Medications   DISCONTD: potassium chloride SA (KLOR-CON M20) 20 MEQ tablet    Sig: Take 1 tablet (20 mEq total) by mouth daily. Can Take Additional Tablet When Taking Additional Lasix    Dispense:  45 tablet    Refill:  2   furosemide (LASIX) 40 MG tablet    Sig: Take 1 tablet (40 mg total) by mouth daily. Can Take Additional Tablet for Weight Gain and Lower Extremity Edema.    Dispense:  45 tablet    Refill:  2    No  refill needed. Please noted dose change   potassium chloride (KLOR-CON) 10 MEQ tablet    Sig: Take 2 tablets (20 mEq total) by mouth daily.    Dispense:  60 tablet    Refill:  2    Patient Instructions  Medication Instructions:  Increase Lasix 40 mg ( Take 1 Tablet Daily). Increase Potassium to 20 meq ( Take 1 Tablet Daily). *If you need a refill on your cardiac medications before your next appointment, please call your  pharmacy*   Lab Work: BMET,BNP,Magnesium. If you have labs (blood work) drawn today and your tests are completely normal, you will receive your results only by: MyChart Message (if you have MyChart) OR A paper copy in the mail If you have any lab test that is abnormal or we need to change your treatment, we will call you to review the results.   Testing/Procedures: [address] Your physician has requested that you have an echocardiogram. Echocardiography is a painless test that uses sound waves to create images of your heart. It provides your doctor with information about the size and shape of your heart and how well your heart's chambers and valves are working. This procedure takes approximately one hour. There are no restrictions for this procedure.    Follow-Up: At [facility], you and your health needs are our priority.  As part of our continuing mission to provide you with exceptional heart care, we have created designated Provider Care Teams.  These Care Teams include your primary Cardiologist (physician) and Advanced Practice Providers (APPs -  Physician Assistants and Nurse Practitioners) who all work together to provide you with the care you need, when you need it.  We recommend signing up for the patient portal called "MyChart".  Sign up information is provided on this After Visit Summary.  MyChart is used to connect with patients for Virtual Visits (Telemedicine).  Patients are able to view lab/test results, encounter notes, upcoming appointments, etc.  Non-urgent messages can be sent to your provider as well.   To learn more about what you can do with MyChart, go to [email or web address].    Your next appointment:   1 month(s)  The format for your next appointment:   In Person  Provider:   Marjie Skiff, PA-C    Then, Thurmon Fair, MD will plan to see you again in 6 month(s).    Other Instructions Heart Failure Education:  Weigh yourself  EVERY morning after you go to the bathroom but before you eat or drink anything. Write this number down in a weight log/diary. If you gain 3 pounds overnight or 5 pounds in a week, call the office. Take your medicines as prescribed. If you have concerns about your medications, please call us before you stop taking them. Eat low salt foods--Limit salt (sodium) to 2000 mg per day. This will help prevent your body from holding onto fluid. Read food labels as many processed foods have a lot of sodium, especially canned goods and prepackaged meats. If you would like some assistance choosing low sodium foods, we would be happy to set you up with a nutritionist. Limit all fluids for the day to less than 2 liters (64 ounces). Fluid includes all drinks, coffee, juice, ice chips, soup, jello, and all other liquids. Stay as active as you can everyday. Staying active will give you more energy and make your muscles stronger. Start with 5 minutes at a time and work your way up to 30 minutes  a day. Break up your activities--do some in the morning and some in the afternoon. Start with 3 days per week and work your way up to 5 days as you can.  If you have chest pain, feel short of breath, dizzy, or lightheaded, STOP. If you don't feel better after a short rest, call 911. If you do feel better, call the office to let us know you have symptoms with exercise.    Important Information About Sugar         Signed, Corrin Parker, PA-C  09/16/2021 1:10 PM    [facility]

## 2021-09-15 ENCOUNTER — Encounter: Payer: Self-pay | Admitting: Student

## 2021-09-15 DIAGNOSIS — I50812 Chronic right heart failure: Secondary | ICD-10-CM | POA: Insufficient documentation

## 2021-09-16 ENCOUNTER — Encounter: Payer: Self-pay | Admitting: Student

## 2021-09-16 ENCOUNTER — Ambulatory Visit: Payer: Medicare HMO | Admitting: Student

## 2021-09-16 VITALS — BP 134/72 | HR 66 | Ht 66.0 in | Wt 281.2 lb

## 2021-09-16 DIAGNOSIS — I50811 Acute right heart failure: Secondary | ICD-10-CM

## 2021-09-16 DIAGNOSIS — I50812 Chronic right heart failure: Secondary | ICD-10-CM

## 2021-09-16 DIAGNOSIS — Q211 Atrial septal defect, unspecified: Secondary | ICD-10-CM | POA: Diagnosis not present

## 2021-09-16 DIAGNOSIS — I872 Venous insufficiency (chronic) (peripheral): Secondary | ICD-10-CM

## 2021-09-16 DIAGNOSIS — I10 Essential (primary) hypertension: Secondary | ICD-10-CM | POA: Diagnosis not present

## 2021-09-16 DIAGNOSIS — E785 Hyperlipidemia, unspecified: Secondary | ICD-10-CM | POA: Diagnosis not present

## 2021-09-16 DIAGNOSIS — G4733 Obstructive sleep apnea (adult) (pediatric): Secondary | ICD-10-CM

## 2021-09-16 MED ORDER — FUROSEMIDE 40 MG PO TABS: 40.0000 mg | ORAL_TABLET | Freq: Every day | ORAL | 2 refills | Status: DC

## 2021-09-16 MED ORDER — POTASSIUM CHLORIDE CRYS ER 20 MEQ PO TBCR: 20.0000 meq | EXTENDED_RELEASE_TABLET | Freq: Every day | ORAL | 2 refills | Status: DC

## 2021-09-16 MED ORDER — POTASSIUM CHLORIDE ER 10 MEQ PO TBCR: 20.0000 meq | EXTENDED_RELEASE_TABLET | Freq: Every day | ORAL | 2 refills | Status: DC

## 2021-09-17 LAB — BASIC METABOLIC PANEL
BUN/Creatinine Ratio: 18 (ref 12–28)
BUN: 17 mg/dL (ref 8–27)
CO2: 26 mmol/L (ref 20–29)
Calcium: 9.3 mg/dL (ref 8.7–10.3)
Chloride: 104 mmol/L (ref 96–106)
Creatinine, Ser: 0.92 mg/dL (ref 0.57–1.00)
Glucose: 90 mg/dL (ref 70–99)
Potassium: 4.6 mmol/L (ref 3.5–5.2)
Sodium: 141 mmol/L (ref 134–144)
eGFR: 65 mL/min/{1.73_m2} (ref >59)

## 2021-09-17 LAB — BRAIN NATRIURETIC PEPTIDE: BNP: 45.2 pg/mL (ref 0.0–100.0)

## 2021-09-17 LAB — MAGNESIUM: Magnesium: 2.1 mg/dL (ref 1.6–2.3)

## 2021-09-24 ENCOUNTER — Other Ambulatory Visit: Payer: Self-pay

## 2021-09-24 DIAGNOSIS — I50812 Chronic right heart failure: Secondary | ICD-10-CM

## 2021-09-24 DIAGNOSIS — Z79899 Other long term (current) drug therapy: Secondary | ICD-10-CM

## 2021-09-24 DIAGNOSIS — I5189 Other ill-defined heart diseases: Secondary | ICD-10-CM

## 2021-09-25 DIAGNOSIS — E559 Vitamin D deficiency, unspecified: Secondary | ICD-10-CM | POA: Diagnosis not present

## 2021-09-25 DIAGNOSIS — R7303 Prediabetes: Secondary | ICD-10-CM | POA: Diagnosis not present

## 2021-09-25 DIAGNOSIS — I509 Heart failure, unspecified: Secondary | ICD-10-CM | POA: Diagnosis not present

## 2021-09-25 DIAGNOSIS — R69 Illness, unspecified: Secondary | ICD-10-CM | POA: Diagnosis not present

## 2021-10-01 ENCOUNTER — Ambulatory Visit (HOSPITAL_COMMUNITY): Payer: Medicare HMO | Attending: Student

## 2021-10-02 ENCOUNTER — Encounter (HOSPITAL_COMMUNITY): Payer: Self-pay | Admitting: Student

## 2021-10-13 NOTE — Progress Notes (Signed)
Cardiology Office Note:    Date:  10/17/2021   ID:  Amber Cook, DOB 08-Apr-1945, MRN [id number]  PCP:  Holland Commons, FNP  Cardiologist:  Sanda Klein, MD  Electrophysiologist:  None   Referring MD: Holland Commons, FNP   Chief Complaint: follow-up of right sided heart failure  History of Present Illness:    Amber Cook is a 76 y.o. female with a history of normal coronaries on cardiac catheterization in 2008, ostium secundum atrial septal defect s/p patch closure with autologous pericardium in 09/2006 after a failed percutaneous closure, chronic right sided heart failure, hypertension, hyperlipidemia intolerant to statins and Zetia, chronic venous insufficiency s/p ablation of the right greater saphenous vein, obstructive sleep apnea on CPAP, hypothyroidism, hyperparathyroidism, and morbid obesity who is followed by Dr. Sallyanne Kuster and presents today for follow-up of right sided heart failure   Most recent Echo in 10/2018 showed LVEF of 55-60% with mild asymmetric LVH and grade 1 diastolic dysfunction but no evidence of impaired relaxation as well as normal RV with mildly reduced systolic function and RVSP of 36 mmHg. No evidence of any residual ASD.   Patient was recently seen by me on 09/16/2021 at which time she reported worsening lower extremity edema that started about 3-4 weeks prior. She also reported weight gain. Weight was up 4 lbs since 04/2021 but almost 20 lbs since last visit with Dr. Sallyanne Kuster in 09/2020. She admitted to eating a high sodium diet. BNP was normal. She was only taking Lasix '40mg'$  as needed so this was increased to '40mg'$  daily and patient was instructed that she could take an additional '40mg'$  as needed for weight gain or worsening lower extremity edema. Repeat Echo was ordered but patient forgot about this appointment and no showed.  Patient presents today for follow-up. Here alone. Her weight is up 2 lbs since last visit. She has not been taking her Lasix  every day as instructed. She has been taking it every other day and she takes it a night because she does not want to have to be running to the bathroom when she is out and about. She is also still non-compliant with salt and fluid restrictions. She eats out frequently and it sounds like she drinks a lot of fluid throughout the day (she starts each day with 20 ounces of coffee). She has chronic lower extremity which is unchanged from last visit. She reports mild shortness of breath with exertion but states this is not new and is stable. No shortness of breath at rest. No orthopnea or PND. She reports very atypical chest pain that occurs randomly and only last for a few seconds at a time. This does not sound like cardiac chest pain. No palpitations, lightheadedness, dizziness, or syncope.   Of note, she does not feel like she is having as much urinary output with the Lasix '40mg'$  daily. She has previously taken '80mg'$  daily but states she thought she was going to die due to severe cramps. She does not have a scale at home (she bought an expensive one and then it broke) but she states she does feel like her clothes are looser after she takes Lasix sometimes.  Past Medical History:  Diagnosis Date   Arthritis    ASD (atrial septal defect), ostium secundum    surgical repair 7/08   Chronic right-sided CHF (congestive heart failure) (HCC)    Colitis    Depression    takes Lexapro daily   Diabetes mellitus without  complication ([place])    borderline   Diastolic dysfunction 90/24/0973   grade 1   Elevated parathyroid hormone    GERD (gastroesophageal reflux disease)    but doesn't require meds   History of bronchitis    a month ago   Hypercalcemia    Hyperlipidemia    takes Welchol bid    Hypertension    takes Metoprolol nightly    Hypothyroidism    takes Synthroid daily   Internal hemorrhoids    Peripheral neuropathy    Pneumonia    hx of->a yr ago   Sleep apnea    supposed to use CPAP;repeat  study tonight @ [facility]   Urinary frequency    takes Lasix daily   Vaginal yeast infection    Venous insufficiency    SVG ablation    Past Surgical History:  Procedure Laterality Date   2d echo with bubble study  03/31/12   atral septal defect repair  July 2009   after failed ASD closure   CARDIAC CATHETERIZATION  07/21/2006   Right & Left: normal coronary arteries, mod. pulmonary hypertension w/evidence of left to right shunting.   CERVICAL BIOPSY     COLONOSCOPY     left breast biopsy     NM MYOCAR PERF WALL MOTION  07/15/2006   high risk scan -   PARATHYROID EXPLORATION  04/05/2012   Procedure: PARATHYROID EXPLORATION;  Surgeon: Pedro Earls, MD;  Location: [facility];  Service: General;  Laterality: N/A;  Exploration of parathryroid and removal of left and right interior parathyroid   PARATHYROIDECTOMY     right vein ligation  2009   TONSILLECTOMY AND ADENOIDECTOMY     and adenoids at age 45   TRANSESOPHAGEAL ECHOCARDIOGRAM  08/13/2006   unsuccessful attempt at ASD closure    Current Medications: Current Meds  Medication Sig   aspirin 81 MG tablet Take 1 tablet (81 mg total) by mouth daily.   escitalopram (LEXAPRO) 20 MG tablet Take 1 tablet by mouth daily.   furosemide (LASIX) 20 MG tablet Take 60 mg by mouth daily.   furosemide (LASIX) 20 MG tablet Take 3 tablets (60 mg total) by mouth daily.   latanoprost (XALATAN) 0.005 % ophthalmic solution Place 1 drop into both eyes daily.   levothyroxine (SYNTHROID) 150 MCG tablet Take 150 mcg by mouth daily.   Melatonin 1 MG TABS Take by mouth.   metoprolol succinate (TOPROL-XL) 25 MG 24 hr tablet Take 1 tablet (25 mg total) by mouth daily.   MULTIPLE VITAMIN PO Take by mouth daily. Pt isn't sure of the dosage   sodium chloride (OCEAN) 0.65 % SOLN nasal spray Place 1 spray into both nostrils as needed for congestion.   Specialty Vitamins Products (MAGNESIUM, AMINO ACID CHELATE,) 133 MG tablet Take 1 tablet by mouth as needed. 400  mg prn   spironolactone (ALDACTONE) 25 MG tablet Take 2 tablets by mouth daily.   [DISCONTINUED] potassium chloride (KLOR-CON) 10 MEQ tablet Take 2 tablets (20 mEq total) by mouth daily.     Allergies:   Erythromycin, Codeine, Lidocaine, Other, Prilosec [omeprazole], and Statins   Social History   Socioeconomic History   Marital status: Single    Spouse name: Not on file   Number of children: Not on file   Years of education: Not on file   Highest education level: Not on file  Occupational History   Not on file  Tobacco Use   Smoking status: Never   Smokeless tobacco: Never  Vaping Use   Vaping Use: Never used  Substance and Sexual Activity   Alcohol use: No   Drug use: No   Sexual activity: Not on file  Other Topics Concern   Not on file  Social History Narrative   Not on file   Social Determinants of Health   Financial Resource Strain: Not on file  Food Insecurity: Not on file  Transportation Needs: Not on file  Physical Activity: Not on file  Stress: Not on file  Social Connections: Not on file     Family History: The patient's family history includes Asthma in her sister; Cancer in her maternal grandfather, maternal uncle, and mother; Diabetes in her sister and sister; Heart attack in her maternal grandmother; Heart failure in her paternal grandfather; Hyperlipidemia in her sister and sister; Hypertension in her sister and sister; Kidney disease in her paternal grandfather; Pneumonia in her father.  ROS:   Please see the history of present illness.     EKGs/Labs/Other Studies Reviewed:    The following studies were reviewed today:  Echocardiogram 11/18/2018: Impressions:  1. The left ventricle has normal systolic function, with an ejection  fraction of 55-60%. The cavity size was normal. There is mild asymmetric  left ventricular hypertrophy. Left ventricular diastolic Doppler  parameters are consistent with impaired  relaxation. No evidence of left  ventricular regional wall motion  abnormalities.   2. The right ventricle has mildly reduced systolic function. The cavity  was normal. There is no increase in right ventricular wall thickness.  Right ventricular systolic pressure is normal with an estimated pressure  of 36.0 mmHg.   3. The aorta is normal unless otherwise noted.   EKG:  EKG not ordered today.   Recent Labs: 09/16/2021: BNP 45.2; BUN 17; Creatinine, Ser 0.92; Magnesium 2.1; Potassium 4.6; Sodium 141  Recent Lipid Panel    Component Value Date/Time   CHOL 279 (H) 04/01/2012 1436   CHOL 282 (H) 04/01/2012 1436   TRIG 256 (H) 04/01/2012 1436   TRIG 267 (H) 04/01/2012 1436   HDL 44 04/01/2012 1436   HDL 45 04/01/2012 1436   CHOLHDL 6.3 04/01/2012 1436   CHOLHDL 6.3 04/01/2012 1436   VLDL 51 (H) 04/01/2012 1436   VLDL 53 (H) 04/01/2012 1436   LDLCALC 184 (H) 04/01/2012 1436   LDLCALC 184 (H) 04/01/2012 1436    Physical Exam:    Vital Signs: BP 128/64 (BP Location: Left Arm, Patient Position: Sitting, Cuff Size: Large)   Pulse 81   Ht '5\' 6"'$  (1.676 m)   Wt 283 lb (128.4 kg)   BMI 45.68 kg/m     Wt Readings from Last 3 Encounters:  10/17/21 283 lb (128.4 kg)  09/16/21 281 lb 3.2 oz (127.6 kg)  05/02/21 277 lb (125.6 kg)     General: 76 y.o. morbidly obese Caucasian female in no acute distress. HEENT: Normocephalic and atraumatic. Sclera clear.  Neck: Supple. JVD difficult to assess due to body habitus. Heart: RRR. Distinct S1 and S2. No murmurs, gallops, or rubs.  Lungs: No increased work of breathing. Clear to ausculation bilaterally. No wheezes, rhonchi, or rales.  Abdomen: Soft, obese, and non-tender to palpation.  Extremities: 2-3+ pitting edema of bilateral lower extremities (left chronically larger than right). Skin: Warm and dry. Neuro: Alert and oriented x3. No focal deficits. Psych: Normal affect. Responds appropriately.  Assessment:    1. Chronic right heart failure ([place])   2. ASD (atrial  septal defect), ostium secundum s/p closure  3. Atypical chest pain   4. Primary hypertension   5. Hyperlipidemia, unspecified hyperlipidemia type   6. Chronic venous insufficiency   7. Obstructive sleep apnea     Plan:    Chronic Right Heart Failure Last Echo in 10/2018 showed LVEF of 55-60% with mild asymmetric LVH and grade 1 diastolic dysfunction but no evidence of impaired relaxation as well as normal RV with mildly reduced systolic function and RVSP of 36 mmHg. No evidence of any residual ASD. Patient reported worsening lower extremity edema and weight gain at visit last month. BNP was normal. Lasix was increased. - Ordered repeat Echo at last visit but patient no-showed this. This has been rescheduled.  - Weight up 2 lbs since last visit. She has chronic lower extremity edema but lungs clear.  - She has only been taking Lasix '40mg'$  about every other day but does not sound like she is having significant urinary output with this. Will increase Lasix to '60mg'$  once daily. - Continue Spironolactone '50mg'$  daily. - Discussed the importance of  daily weights and sodium/fluid restrictions. I fear compliance is going to continue to be an ongoing issue here. She does not have a working scale at home. I will see if we have one in the office we can give her. - Will repeat BMET today.   Ostium Secundum Atrial Septal Defect s/p Closure in 2008 S/p patch closure with autologous pericardium in 09/2006 after failed percutaneous closure. No evidence of residual ASD on last Echo in 2020. - Will repeat Echo as above.  Atypical Chest Pain Patient describes very atypical chest pain that occurs randomly and only last for a couple of seconds at a time. - This does not sound like cardiac chest pain. No additional work-up necessary at this time.  Hypertension BP reasonably well controlled.  - Continue current medications: Spironolactone '50mg'$  daily and Toprol-XL '25mg'$  daily.    Hyperlipidemia Most recent lipid  panel in 03/2021 at PCP's office: Total Cholesterol 233, Triglycerides 97, HDL 49, LDL 165.  - Intolerant to multiple statins in past as well as Zetia. Previously did well on Repatha but her patient assistance but dropped a couple of years ago. I will reach out to Pharmacy to see if we can reapply for patient assistance.   Chronic Venous Insufficiency S/p ablation of the right greater saphenous vein. She has chronic lower extremity as described above. - Continue Lasix as above. - We have previously discussed the importance of elevating legs as much as possible, wearing compression stockings, and limiting sodium intake. There are some compliance issues.   Obstructive Sleep Apnea Non-compliant with CPAP. -  Recommended using this consistently.  Disposition: Follow up in 2 months.   Medication Adjustments/Labs and Tests Ordered: Current medicines are reviewed at length with the patient today.  Concerns regarding medicines are outlined above.  Orders Placed This Encounter  Procedures   Basic metabolic panel   Meds ordered this encounter  Medications   furosemide (LASIX) 20 MG tablet    Sig: Take 3 tablets (60 mg total) by mouth daily.    Dispense:  90 tablet    Refill:  3   potassium chloride (KLOR-CON) 10 MEQ tablet    Sig: Take 2 tablets (20 mEq total) by mouth daily.    Dispense:  60 tablet    Refill:  2    Please provide pt with 10 mEQ tablets. 65 mEQ is too large for pt.    Patient Instructions  Medication Instructions:  Increase  Lasix 60 mg daily  *If you need a refill on your cardiac medications before your next appointment, please call your pharmacy*   Lab Work: Your physician recommends that you complete labs today BMET  If you have labs (blood work) drawn today and your tests are completely normal, you will receive your results only by: MyChart Message (if you have MyChart) OR A paper copy in the mail If you have any lab test that is abnormal or we need to change  your treatment, we will call you to review the results.   Testing/Procedures: NONE ordered at this time of appointment    Follow-Up: At [facility], you and your health needs are our priority.  As part of our continuing mission to provide you with exceptional heart care, we have created designated Provider Care Teams.  These Care Teams include your primary Cardiologist (physician) and Advanced Practice Providers (APPs -  Physician Assistants and Nurse Practitioners) who all work together to provide you with the care you need, when you need it.  We recommend signing up for the patient portal called "MyChart".  Sign up information is provided on this After Visit Summary.  MyChart is used to connect with patients for Virtual Visits (Telemedicine).  Patients are able to view lab/test results, encounter notes, upcoming appointments, etc.  Non-urgent messages can be sent to your provider as well.   To learn more about what you can do with MyChart, go to [email or web address].    Your next appointment:   2-3 month(s)  The format for your next appointment:   In Person  Provider:   Sande Rives, PA-C        Other Instructions Heart Failure Education:  Weigh yourself EVERY morning after you go to the bathroom but before you eat or drink anything. Write this number down in a weight log/diary. If you gain 3 pounds overnight or 5 pounds in a week, call the office. Take your medicines as prescribed. If you have concerns about your medications, please call us before you stop taking them. Eat low salt foods--Limit salt (sodium) to 2000 mg per day. This will help prevent your body from holding onto fluid. Read food labels as many processed foods have a lot of sodium, especially canned goods and prepackaged meats. If you would like some assistance choosing low sodium foods, we would be happy to set you up with a nutritionist. Limit all fluids for the day to less than 2 liters (64 ounces). Fluid  includes all drinks, coffee, juice, ice chips, soup, jello, and all other liquids. Stay as active as you can everyday. Staying active will give you more energy and make your muscles stronger. Start with 5 minutes at a time and work your way up to 30 minutes a day. Break up your activities--do some in the morning and some in the afternoon. Start with 3 days per week and work your way up to 5 days as you can.  If you have chest pain, feel short of breath, dizzy, or lightheaded, STOP. If you don't feel better after a short rest, call 911. If you do feel better, call the office to let us know you have symptoms with exercise.    Important Information About Sugar         Signed, Eppie Gibson  10/17/2021 5:57 PM    [facility]

## 2021-10-17 ENCOUNTER — Ambulatory Visit: Payer: Medicare HMO | Admitting: Student

## 2021-10-17 ENCOUNTER — Encounter: Payer: Self-pay | Admitting: Student

## 2021-10-17 VITALS — BP 128/64 | HR 81 | Ht 66.0 in | Wt 283.0 lb

## 2021-10-17 DIAGNOSIS — E785 Hyperlipidemia, unspecified: Secondary | ICD-10-CM | POA: Diagnosis not present

## 2021-10-17 DIAGNOSIS — I872 Venous insufficiency (chronic) (peripheral): Secondary | ICD-10-CM | POA: Diagnosis not present

## 2021-10-17 DIAGNOSIS — G4733 Obstructive sleep apnea (adult) (pediatric): Secondary | ICD-10-CM

## 2021-10-17 DIAGNOSIS — I50812 Chronic right heart failure: Secondary | ICD-10-CM | POA: Diagnosis not present

## 2021-10-17 DIAGNOSIS — I10 Essential (primary) hypertension: Secondary | ICD-10-CM | POA: Diagnosis not present

## 2021-10-17 DIAGNOSIS — Q2111 Secundum atrial septal defect: Secondary | ICD-10-CM

## 2021-10-17 DIAGNOSIS — R0789 Other chest pain: Secondary | ICD-10-CM

## 2021-10-17 MED ORDER — POTASSIUM CHLORIDE ER 10 MEQ PO TBCR: 20.0000 meq | EXTENDED_RELEASE_TABLET | Freq: Every day | ORAL | 2 refills | Status: DC

## 2021-10-17 MED ORDER — FUROSEMIDE 20 MG PO TABS: 60.0000 mg | ORAL_TABLET | Freq: Every day | ORAL | 3 refills | Status: DC

## 2021-10-17 NOTE — Patient Instructions (Signed)
Medication Instructions:  Increase Lasix 60 mg daily  *If you need a refill on your cardiac medications before your next appointment, please call your pharmacy*   Lab Work: Your physician recommends that you complete labs today BMET  If you have labs (blood work) drawn today and your tests are completely normal, you will receive your results only by: [place] (if you have MyChart) OR A paper copy in the mail If you have any lab test that is abnormal or we need to change your treatment, we will call you to review the results.   Testing/Procedures: NONE ordered at this time of appointment    Follow-Up: At [facility], you and your health needs are our priority.  As part of our continuing mission to provide you with exceptional heart care, we have created designated Provider Care Teams.  These Care Teams include your primary Cardiologist (physician) and Advanced Practice Providers (APPs -  Physician Assistants and Nurse Practitioners) who all work together to provide you with the care you need, when you need it.  We recommend signing up for the patient portal called "MyChart".  Sign up information is provided on this After Visit Summary.  MyChart is used to connect with patients for Virtual Visits (Telemedicine).  Patients are able to view lab/test results, encounter notes, upcoming appointments, etc.  Non-urgent messages can be sent to your provider as well.   To learn more about what you can do with MyChart, go to [email or web address].    Your next appointment:   2-3 month(s)  The format for your next appointment:   In Person  Provider:   Sande Rives, PA-C        Other Instructions Heart Failure Education:  Weigh yourself EVERY morning after you go to the bathroom but before you eat or drink anything. Write this number down in a weight log/diary. If you gain 3 pounds overnight or 5 pounds in a week, call the office. Take your medicines as prescribed. If you have  concerns about your medications, please call us before you stop taking them. Eat low salt foods--Limit salt (sodium) to 2000 mg per day. This will help prevent your body from holding onto fluid. Read food labels as many processed foods have a lot of sodium, especially canned goods and prepackaged meats. If you would like some assistance choosing low sodium foods, we would be happy to set you up with a nutritionist. Limit all fluids for the day to less than 2 liters (64 ounces). Fluid includes all drinks, coffee, juice, ice chips, soup, jello, and all other liquids. Stay as active as you can everyday. Staying active will give you more energy and make your muscles stronger. Start with 5 minutes at a time and work your way up to 30 minutes a day. Break up your activities--do some in the morning and some in the afternoon. Start with 3 days per week and work your way up to 5 days as you can.  If you have chest pain, feel short of breath, dizzy, or lightheaded, STOP. If you don't feel better after a short rest, call 911. If you do feel better, call the office to let us know you have symptoms with exercise.    Important Information About Sugar

## 2021-10-18 LAB — BASIC METABOLIC PANEL
BUN/Creatinine Ratio: 21 (ref 12–28)
BUN: 23 mg/dL (ref 8–27)
CO2: 25 mmol/L (ref 20–29)
Calcium: 9.5 mg/dL (ref 8.7–10.3)
Chloride: 103 mmol/L (ref 96–106)
Creatinine, Ser: 1.08 mg/dL — ABNORMAL HIGH (ref 0.57–1.00)
Glucose: 120 mg/dL — ABNORMAL HIGH (ref 70–99)
Potassium: 4 mmol/L (ref 3.5–5.2)
Sodium: 143 mmol/L (ref 134–144)
eGFR: 53 mL/min/{1.73_m2} — ABNORMAL LOW (ref >59)

## 2021-10-29 ENCOUNTER — Other Ambulatory Visit: Payer: Self-pay

## 2021-10-29 ENCOUNTER — Telehealth: Payer: Self-pay | Admitting: Cardiovascular Disease

## 2021-10-29 DIAGNOSIS — I50812 Chronic right heart failure: Secondary | ICD-10-CM

## 2021-10-29 NOTE — Telephone Encounter (Signed)
Follow Up:      Patient is returning call from today, concerning her lab results.

## 2021-10-29 NOTE — Telephone Encounter (Signed)
Darreld Mclean, PA-C  10/20/2021  8:26 AM EDT     Please notify patient of results: Creatinine which measure kidney function is slightly higher than where it was at last check but still stable and around baseline. Continue with Lasix '60mg'$  daily. Recommend repeat BMET in 1 week.   Thank you!    Patient aware and verbalized understanding.     Per result note-sister made aware as well.   Order active.

## 2021-11-04 ENCOUNTER — Other Ambulatory Visit: Payer: Self-pay

## 2021-11-04 ENCOUNTER — Ambulatory Visit (HOSPITAL_COMMUNITY): Payer: Medicare HMO | Attending: Cardiovascular Disease

## 2021-11-04 DIAGNOSIS — Z79899 Other long term (current) drug therapy: Secondary | ICD-10-CM

## 2021-11-04 DIAGNOSIS — Q211 Atrial septal defect, unspecified: Secondary | ICD-10-CM | POA: Insufficient documentation

## 2021-11-04 DIAGNOSIS — G4733 Obstructive sleep apnea (adult) (pediatric): Secondary | ICD-10-CM | POA: Diagnosis not present

## 2021-11-04 DIAGNOSIS — I50812 Chronic right heart failure: Secondary | ICD-10-CM | POA: Diagnosis not present

## 2021-11-04 DIAGNOSIS — I872 Venous insufficiency (chronic) (peripheral): Secondary | ICD-10-CM | POA: Insufficient documentation

## 2021-11-04 DIAGNOSIS — I10 Essential (primary) hypertension: Secondary | ICD-10-CM | POA: Diagnosis not present

## 2021-11-04 DIAGNOSIS — I50811 Acute right heart failure: Secondary | ICD-10-CM | POA: Diagnosis not present

## 2021-11-04 DIAGNOSIS — E785 Hyperlipidemia, unspecified: Secondary | ICD-10-CM | POA: Diagnosis not present

## 2021-11-04 DIAGNOSIS — I5189 Other ill-defined heart diseases: Secondary | ICD-10-CM | POA: Diagnosis not present

## 2021-11-04 LAB — ECHOCARDIOGRAM COMPLETE
Area-P 1/2: 3.25 cm2
S' Lateral: 2.6 cm

## 2021-11-05 LAB — BASIC METABOLIC PANEL
BUN/Creatinine Ratio: 20 (ref 12–28)
BUN: 19 mg/dL (ref 8–27)
CO2: 25 mmol/L (ref 20–29)
Calcium: 9.6 mg/dL (ref 8.7–10.3)
Chloride: 103 mmol/L (ref 96–106)
Creatinine, Ser: 0.97 mg/dL (ref 0.57–1.00)
Glucose: 97 mg/dL (ref 70–99)
Potassium: 4.6 mmol/L (ref 3.5–5.2)
Sodium: 142 mmol/L (ref 134–144)
eGFR: 61 mL/min/{1.73_m2} (ref >59)

## 2021-11-05 LAB — HEPATIC FUNCTION PANEL
ALT: 12 IU/L (ref 0–32)
AST: 15 IU/L (ref 0–40)
Albumin: 4.1 g/dL (ref 3.8–4.8)
Alkaline Phosphatase: 90 IU/L (ref 44–121)
Bilirubin Total: 0.4 mg/dL (ref 0.0–1.2)
Bilirubin, Direct: <0.1 mg/dL (ref 0.00–0.40)
Total Protein: 6.7 g/dL (ref 6.0–8.5)

## 2021-11-05 LAB — LIPID PANEL
Chol/HDL Ratio: 5.5 ratio — ABNORMAL HIGH (ref 0.0–4.4)
Cholesterol, Total: 248 mg/dL — ABNORMAL HIGH (ref 100–199)
HDL: 45 mg/dL (ref >39)
LDL Chol Calc (NIH): 179 mg/dL — ABNORMAL HIGH (ref 0–99)
Triglycerides: 133 mg/dL (ref 0–149)
VLDL Cholesterol Cal: 24 mg/dL (ref 5–40)

## 2021-11-07 ENCOUNTER — Other Ambulatory Visit: Payer: Self-pay

## 2021-11-07 NOTE — Progress Notes (Signed)
The patient assistance fund is currently closed.  We can have her apply for medication through the [employer].  Let me know if you want to do that - I'll need to do a PA for the med to send in with her application

## 2021-11-11 ENCOUNTER — Ambulatory Visit: Payer: Medicare HMO | Admitting: Podiatry

## 2021-11-11 ENCOUNTER — Encounter: Payer: Self-pay | Admitting: Podiatry

## 2021-11-11 DIAGNOSIS — R600 Localized edema: Secondary | ICD-10-CM

## 2021-11-11 DIAGNOSIS — B351 Tinea unguium: Secondary | ICD-10-CM

## 2021-11-11 DIAGNOSIS — M79675 Pain in left toe(s): Secondary | ICD-10-CM | POA: Diagnosis not present

## 2021-11-11 DIAGNOSIS — E119 Type 2 diabetes mellitus without complications: Secondary | ICD-10-CM

## 2021-11-11 DIAGNOSIS — M79674 Pain in right toe(s): Secondary | ICD-10-CM

## 2021-11-12 ENCOUNTER — Telehealth: Payer: Self-pay

## 2021-11-12 NOTE — Telephone Encounter (Signed)
-----   Message from Rockne Menghini, [place] sent at 11/11/2021  6:13 AM EDT ----- Thank you!  ----- Message ----- From: Waylan Rocher, LPN Sent: [id number]   9:24 AM EDT To: Rockne Menghini, [place] pt and she states that she is changing her insurance "next month" and she will wait to see what her coverage is then. She does not wish to start at this time. She will call back with new insurance. Sharyn Lull ----- Message ----- From: Rockne Menghini, RPH-CPP Sent: 11/07/2021   4:47 PM EDT To: Waylan Rocher, LPN; #  You asked about getting her back on Repatha ( or maybe Callie did?).  [place] is currently closed so we have to apply to manufacturers.  Her current Medicare supplement prefers Pral[employer], so you can print the patient assistance forms for that and have her fill out her part.  We'll need to do a prior auth to attach to her applicaton, showing insurance allows med.  Her current plan has cost at $94 x first two months, then $47/month until she hits the coverage gap, then goes to over $300/month.  She probably wouldn't hit that this year,but the up front costs may be prohibitive.  Let me know what you want to do with this.   Erasmo Downer

## 2021-11-18 NOTE — Progress Notes (Signed)
Subjective: Ernie Hew presents today for diabetic foot evaluation.  Patient denies any h/o foot wounds.  Patient denies any numbness, tingling, burning, or pins/needle sensation in feet.  Last known HgA1c was 6.1%.  Risk factors: diabetes, neuropathy, HTN, CHF, hyperlipidemia.  PCP is Holland Commons, FNP , and last visit was September 25, 2021.  Past Medical History:  Diagnosis Date   Arthritis    ASD (atrial septal defect), ostium secundum    surgical repair 7/08   Chronic right-sided CHF (congestive heart failure) (HCC)    Colitis    Depression    takes Lexapro daily   Diabetes mellitus without complication (HCC)    borderline   Diastolic dysfunction 08/67/6195   grade 1   Elevated parathyroid hormone    GERD (gastroesophageal reflux disease)    but doesn't require meds   History of bronchitis    a month ago   Hypercalcemia    Hyperlipidemia    takes Welchol bid    Hypertension    takes Metoprolol nightly    Hypothyroidism    takes Synthroid daily   Internal hemorrhoids    Peripheral neuropathy    Pneumonia    hx of->a yr ago   Sleep apnea    supposed to use CPAP;repeat study tonight @ [facility]   Urinary frequency    takes Lasix daily   Vaginal yeast infection    Venous insufficiency    SVG ablation    Patient Active Problem List   Diagnosis Date Noted   Chronic right-sided CHF (congestive heart failure) ([place])    Bilateral lower extremity edema 01/25/2014   Morbid obesity ([place]) 01/25/2014   OSA on CPAP 11/19/2012   Hyperlipidemia    Hypertension    Peripheral neuropathy    Hypercalcemia    Elevated parathyroid hormone    Arthritis    Diabetes mellitus without complication (HCC)    GERD (gastroesophageal reflux disease)    Hypothyroidism    ASD (atrial septal defect), ostium secundum    Venous insufficiency    Diastolic dysfunction 09/32/6712   Status post patch closure of ASD July 2009 after failed percutaneous closure attempt  01/06/2012    Past Surgical History:  Procedure Laterality Date   2d echo with bubble study  03/31/12   atral septal defect repair  July 2009   after failed ASD closure   CARDIAC CATHETERIZATION  07/21/2006   Right & Left: normal coronary arteries, mod. pulmonary hypertension w/evidence of left to right shunting.   CERVICAL BIOPSY     COLONOSCOPY     left breast biopsy     NM MYOCAR PERF WALL MOTION  07/15/2006   high risk scan -   PARATHYROID EXPLORATION  04/05/2012   Procedure: PARATHYROID EXPLORATION;  Surgeon: Pedro Earls, MD;  Location: [facility];  Service: General;  Laterality: N/A;  Exploration of parathryroid and removal of left and right interior parathyroid   PARATHYROIDECTOMY     right vein ligation  2009   TONSILLECTOMY AND ADENOIDECTOMY     and adenoids at age 5   TRANSESOPHAGEAL ECHOCARDIOGRAM  08/13/2006   unsuccessful attempt at ASD closure    Current Outpatient Medications on File Prior to Visit  Medication Sig Dispense Refill   Cholecalciferol (VITAMIN D3) 125 MCG (5000 UT) CAPS 1 capsule     aspirin 81 MG tablet Take 1 tablet (81 mg total) by mouth daily. 90 tablet 3   escitalopram (LEXAPRO) 20 MG tablet Take 1 tablet by mouth  daily.  5   furosemide (LASIX) 20 MG tablet Take 60 mg by mouth daily.     furosemide (LASIX) 20 MG tablet Take 3 tablets (60 mg total) by mouth daily. 90 tablet 3   latanoprost (XALATAN) 0.005 % ophthalmic solution Place 1 drop into both eyes daily.  11   levothyroxine (SYNTHROID) 150 MCG tablet Take 150 mcg by mouth daily.     Melatonin 1 MG TABS Take by mouth.     metoprolol succinate (TOPROL-XL) 25 MG 24 hr tablet Take 1 tablet (25 mg total) by mouth daily. 90 tablet 3   MULTIPLE VITAMIN PO Take by mouth daily. Pt isn't sure of the dosage     potassium chloride (KLOR-CON) 10 MEQ tablet Take 2 tablets (20 mEq total) by mouth daily. 60 tablet 2   potassium chloride SA (KLOR-CON M) 20 MEQ tablet Take 20 mEq by mouth daily.     sodium  chloride (OCEAN) 0.65 % SOLN nasal spray Place 1 spray into both nostrils as needed for congestion. 60 mL 0   Specialty Vitamins Products (MAGNESIUM, AMINO ACID CHELATE,) 133 MG tablet Take 1 tablet by mouth as needed. 400 mg prn     spironolactone (ALDACTONE) 25 MG tablet Take 2 tablets by mouth daily.  99   [DISCONTINUED] niacin (NIASPAN) 1000 MG CR tablet      No current facility-administered medications on file prior to visit.     Allergies  Allergen Reactions   Erythromycin Nausea And Vomiting    Stomach pain   Ciclesonide     Other reaction(s): Unknown   Codeine Nausea Only   Ezetimibe     Other reaction(s): myalgia   Fluvastatin Sodium     Other reaction(s): myalgia   Lidocaine    Other     pomagranite--tongue itching  Nuts-tongue with a funny feeling   Pomegranate (Punica Granatum)     Other reaction(s): Unknown   Prilosec [Omeprazole]    Statins Other (See Comments)    Muscle cramps.     Social History   Occupational History   Not on file  Tobacco Use   Smoking status: Never   Smokeless tobacco: Never  Vaping Use   Vaping Use: Never used  Substance and Sexual Activity   Alcohol use: No   Drug use: No   Sexual activity: Not on file    Family History  Problem Relation Age of Onset   Cancer Mother        leukemia   Pneumonia Father    Heart attack Maternal Grandmother    Cancer Maternal Grandfather        stomach   Heart failure Paternal Grandfather    Kidney disease Paternal Grandfather    Cancer Maternal Uncle        leukemia   Diabetes Sister    Hypertension Sister    Hyperlipidemia Sister    Asthma Sister    Diabetes Sister    Hyperlipidemia Sister    Hypertension Sister      There is no immunization history on file for this patient.  Objective: There were no vitals filed for this visit.  REMONA BOOM is a pleasant 76 y.o. female morbidly obese in NAD. AAO X 3.  Vascular Examination: Vascular status intact b/l with palpable  pedal pulses. Pedal hair present b/l. CFT immediate b/l. No pain with calf compression b/l. Skin temperature gradient WNL b/l. Nonpitting edema noted b/l lower extremities.  Neurological Examination: Sensation grossly intact b/l with 10  gram monofilament. Vibratory sensation intact b/l.   Dermatological Examination: Pedal skin with normal turgor, texture and tone b/l. Toenail  right 2nd digit b/l thick, discolored, elongated with subungual debris and pain on dorsal palpation. No hyperkeratotic lesions noted b/l. Nondystrophic toenails 1-5 left foot, 3-5 right foot, and right great toe.  Musculoskeletal Examination: Normal muscle strength 5/5 to all lower extremity muscle groups bilaterally. No pain, crepitus or joint limitation noted with ROM b/l LE. No gross bony pedal deformities b/l. Patient ambulates independently without assistive aids.  Radiographs: None  Footwear Assessment: Does the patient wear appropriate shoes? Yes. Does the patient need inserts/orthotics? No.  Assessment: 1. Pain due to onychomycosis of toenails of both feet   2. Bilateral lower extremity edema   3. Diabetes mellitus without complication ([place])   4. Encounter for diabetic foot exam ([place])     ADA Risk Categorization: Low Risk:  Patient has all of the following: Intact protective sensation No prior foot ulcer  No severe deformity Pedal pulses present  Plan: -Patient was evaluated and treated. All patient's and/or POA's questions/concerns answered on today's visit. -Diabetic foot examination performed today. -Continue foot and shoe inspections daily. Monitor blood glucose per PCP/Endocrinologist's recommendations. -Patient to continue soft, supportive shoe gear daily. -Toenails R 2nd toe debrided in length and girth without iatrogenic bleeding with sterile nail nipper and dremel.  -Nondystrophic toenails trimmed 1-5 left foot, 3-5 right foot, and right great toe. -Patient/POA to call should there be  question/concern in the interim.  Return in about 3 months (around 02/11/2022).  Marzetta Board, DPM

## 2021-11-20 ENCOUNTER — Telehealth: Payer: Self-pay | Admitting: Cardiovascular Disease

## 2021-11-20 NOTE — Telephone Encounter (Signed)
Amber Rocher, LPN  P Cv Div Nl Scheduling; Amber Rocher, LPN Can you please call pt and schedule a PHARMD appt for Repatha discussion?   Thank you!  Michelle, LPN    LVM x3 with no success in scheduling.

## 2021-12-15 ENCOUNTER — Telehealth: Payer: Self-pay

## 2021-12-15 NOTE — Telephone Encounter (Signed)
From: Waylan Rocher, LPN Sent: 1/98/0221   9:24 AM EDT To: Rockne Menghini, [place] pt and she states that she is changing her insurance "next month" and she will wait to see what her coverage is then. She does not wish to start at this time. She will call back with new insurance. Fernanda Drum pt assistance form filled out. Will await call back from pt.

## 2021-12-15 NOTE — Telephone Encounter (Signed)
-----   Message from Darreld Mclean, [place] sent at 11/07/2021  5:02 PM EDT ----- Sharyn Lull, do you mind calling patient with this information and seeing if she would like to try Praluent?  Thank you! Callie  ----- Message ----- From: Rockne Menghini, RPH-CPP Sent: 11/07/2021   4:47 PM EDT To: Waylan Rocher, LPN; #  You asked about getting her back on Repatha ( or maybe Callie did?).  [facility] is currently closed so we have to apply to manufacturers.  Her current Medicare supplement prefers Praluent, so you can print the patient assistance forms for that and have her fill out her part.  We'll need to do a prior auth to attach to her applicaton, showing insurance allows med.  Her current plan has cost at $94 x first two months, then $47/month until she hits the coverage gap, then goes to over $300/month.  She probably wouldn't hit that this year,but the up front costs may be prohibitive.  Let me know what you want to do with this.   Erasmo Downer

## 2021-12-21 ENCOUNTER — Other Ambulatory Visit: Payer: Self-pay

## 2021-12-21 ENCOUNTER — Encounter (HOSPITAL_BASED_OUTPATIENT_CLINIC_OR_DEPARTMENT_OTHER): Payer: Self-pay | Admitting: Emergency Medicine

## 2021-12-21 ENCOUNTER — Emergency Department (HOSPITAL_BASED_OUTPATIENT_CLINIC_OR_DEPARTMENT_OTHER)
Admission: EM | Admit: 2021-12-21 | Discharge: 2021-12-22 | Disposition: A | Discharge status: Home / Self Care | Payer: Medicare HMO | Attending: Emergency Medicine | Admitting: Emergency Medicine

## 2021-12-21 ENCOUNTER — Emergency Department (HOSPITAL_BASED_OUTPATIENT_CLINIC_OR_DEPARTMENT_OTHER): Payer: Medicare HMO

## 2021-12-21 DIAGNOSIS — W540XXA Bitten by dog, initial encounter: Secondary | ICD-10-CM | POA: Diagnosis not present

## 2021-12-21 DIAGNOSIS — Z7982 Long term (current) use of aspirin: Secondary | ICD-10-CM | POA: Insufficient documentation

## 2021-12-21 DIAGNOSIS — S61052A Open bite of left thumb without damage to nail, initial encounter: Secondary | ICD-10-CM | POA: Insufficient documentation

## 2021-12-21 DIAGNOSIS — Z79899 Other long term (current) drug therapy: Secondary | ICD-10-CM | POA: Diagnosis not present

## 2021-12-21 DIAGNOSIS — Z23 Encounter for immunization: Secondary | ICD-10-CM | POA: Diagnosis not present

## 2021-12-21 MED ORDER — LIDOCAINE HCL 2 % IJ SOLN
10.0000 mL | Freq: Once | INTRAMUSCULAR | Status: AC
  Administered 2021-12-21: 200 mg
  Filled 2021-12-21: qty 20

## 2021-12-21 NOTE — ED Provider Notes (Signed)
[facility] EMERGENCY DEPARTMENT  Provider Note  CSN: [id number] Arrival date & time: 12/21/21 2217  History Chief Complaint  Patient presents with   Animal Bite    SACHA TOPOR is a 76 y.o. female reports she was breaking up a fight between her two dogs when she was bitten on the Left thumb. Took a chunk of skin off. Dogs are UTD on rabies.   Home Medications Prior to Admission medications   Medication Sig Start Date End Date Taking? Authorizing Provider  aspirin 81 MG tablet Take 1 tablet (81 mg total) by mouth daily. 07/27/18   Croitoru, Mihai, MD  Cholecalciferol (VITAMIN D3) 125 MCG (5000 UT) CAPS 1 capsule 09/25/21   [provider]  escitalopram (LEXAPRO) 20 MG tablet Take 1 tablet by mouth daily. 01/12/14   [provider]  furosemide (LASIX) 20 MG tablet Take 60 mg by mouth daily.    [provider]  furosemide (LASIX) 20 MG tablet Take 3 tablets (60 mg total) by mouth daily. 10/17/21   Sande Rives E, PA-C  latanoprost (XALATAN) 0.005 % ophthalmic solution Place 1 drop into both eyes daily. 01/15/15   [provider]  levothyroxine (SYNTHROID) 150 MCG tablet Take 150 mcg by mouth daily.    [provider]  Melatonin 1 MG TABS Take by mouth.    [provider]  metoprolol succinate (TOPROL-XL) 25 MG 24 hr tablet Take 1 tablet (25 mg total) by mouth daily. 09/27/20   Croitoru, Mihai, MD  MULTIPLE VITAMIN PO Take by mouth daily. Pt isn't sure of the dosage    [provider]  potassium chloride (KLOR-CON) 10 MEQ tablet Take 2 tablets (20 mEq total) by mouth daily. 10/17/21   Sande Rives E, PA-C  potassium chloride SA (KLOR-CON M) 20 MEQ tablet Take 20 mEq by mouth daily. 09/16/21   [provider]  sodium chloride (OCEAN) 0.65 % SOLN nasal spray Place 1 spray into both nostrils as needed for congestion. 04/30/21   Etta Quill, NP  Specialty Vitamins Products (MAGNESIUM, AMINO ACID CHELATE,) 133  MG tablet Take 1 tablet by mouth as needed. 400 mg prn    [provider]  spironolactone (ALDACTONE) 25 MG tablet Take 2 tablets by mouth daily. 01/07/15   [provider]  niacin (NIASPAN) 1000 MG CR tablet  08/05/12 06/08/13  [provider]     Allergies    Erythromycin, Ciclesonide, Codeine, Ezetimibe, Fluvastatin sodium, Lidocaine, Other, Pomegranate (punica granatum), Prilosec [omeprazole], and Statins   Review of Systems   Review of Systems Please see HPI for pertinent positives and negatives  Physical Exam BP (!) 141/73   Pulse 87   Temp 98.2 F (36.8 C) (Oral)   Resp 20   SpO2 94%   Physical Exam Vitals and nursing note reviewed.  HENT:     Head: Normocephalic.     Nose: Nose normal.  Eyes:     Extraocular Movements: Extraocular movements intact.  Pulmonary:     Effort: Pulmonary effort is normal.  Musculoskeletal:        General: Normal range of motion.     Cervical back: Neck supple.     Comments: Avulsion of tip of L thumb with partial removal of nail  Skin:    Findings: No rash (on exposed skin).  Neurological:     Mental Status: She is alert and oriented to person, place, and time.  Psychiatric:        Mood  and Affect: Mood normal.     ED Results / Procedures / Treatments   EKG None  Procedures Procedures  Medications Ordered in the ED Medications  lidocaine (XYLOCAINE) 2 % (with pres) injection 200 mg (has no administration in time range)    Initial Impression and Plan  Patient with injury to L thumb, missing a portion of the tip of her digit. Will plan digital block for pain control for more detailed exam. She has lidocaine listed as an allergy, however she states this was one time after dental work she felt 'lightheaded'. Has had lidocaine at other times without issue. I feel it is safe to use a small amount distally for this injury.   ED Course       MDM Rules/Calculators/A&P Medical Decision Making Amount  and/or Complexity of Data Reviewed Radiology: ordered.  Risk Prescription drug management.    Final Clinical Impression(s) / ED Diagnoses Final diagnoses:  None    Rx / DC Orders ED Discharge Orders     None

## 2021-12-21 NOTE — ED Notes (Signed)
Pt back from XRAY 

## 2021-12-21 NOTE — ED Triage Notes (Signed)
Pt reports dog bite to LT thumb when she was trying to break up a fight between her own dogs; dogs are UTD on rabies vaccines

## 2021-12-22 DIAGNOSIS — M79645 Pain in left finger(s): Secondary | ICD-10-CM | POA: Diagnosis not present

## 2021-12-22 MED ORDER — TETANUS-DIPHTH-ACELL PERTUSSIS 5-2.5-18.5 LF-MCG/0.5 IM SUSY
0.5000 mL | PREFILLED_SYRINGE | Freq: Once | INTRAMUSCULAR | Status: AC
  Administered 2021-12-22: 0.5 mL via INTRAMUSCULAR
  Filled 2021-12-22: qty 0.5

## 2021-12-22 MED ORDER — AMOXICILLIN-POT CLAVULANATE 875-125 MG PO TABS
1.0000 | ORAL_TABLET | Freq: Once | ORAL | Status: AC
  Administered 2021-12-22: 1 via ORAL
  Filled 2021-12-22: qty 1

## 2021-12-22 MED ORDER — AMOXICILLIN-POT CLAVULANATE 875-125 MG PO TABS: 1.0000 | ORAL_TABLET | Freq: Two times a day (BID) | ORAL | 0 refills | Status: DC

## 2021-12-22 NOTE — ED Notes (Addendum)
Pt has been soaking thumb in betadine soak

## 2021-12-24 DIAGNOSIS — M79645 Pain in left finger(s): Secondary | ICD-10-CM | POA: Diagnosis not present

## 2021-12-31 DIAGNOSIS — M79645 Pain in left finger(s): Secondary | ICD-10-CM | POA: Diagnosis not present

## 2022-01-14 DIAGNOSIS — M79645 Pain in left finger(s): Secondary | ICD-10-CM | POA: Diagnosis not present

## 2022-01-27 ENCOUNTER — Telehealth: Payer: Self-pay | Admitting: Cardiovascular Disease

## 2022-01-27 MED ORDER — METOPROLOL SUCCINATE ER 25 MG PO TB24: 25.0000 mg | ORAL_TABLET | Freq: Every day | ORAL | 3 refills | Status: DC

## 2022-01-27 NOTE — Telephone Encounter (Signed)
*  STAT* If patient is at the pharmacy, call can be transferred to refill team.   1. Which medications need to be refilled? (please list name of each medication and dose if known)   metoprolol succinate (TOPROL-XL) 25 MG 24 hr tablet   2. Which pharmacy/location (including street and city if local pharmacy) is medication to be sent to?  [id number] - [place], [place] - [address]   3. Do they need a 30 day or 90 day supply? 90 day  Patient stated she is out of this medication.  Patient has an appointment on  12/22.

## 2022-02-04 DIAGNOSIS — M79645 Pain in left finger(s): Secondary | ICD-10-CM | POA: Diagnosis not present

## 2022-02-24 DIAGNOSIS — E7800 Pure hypercholesterolemia, unspecified: Secondary | ICD-10-CM | POA: Diagnosis not present

## 2022-02-24 DIAGNOSIS — E559 Vitamin D deficiency, unspecified: Secondary | ICD-10-CM | POA: Diagnosis not present

## 2022-02-24 DIAGNOSIS — R7303 Prediabetes: Secondary | ICD-10-CM | POA: Diagnosis not present

## 2022-03-03 ENCOUNTER — Encounter: Payer: Self-pay | Admitting: Podiatry

## 2022-03-03 ENCOUNTER — Ambulatory Visit: Payer: Medicare HMO | Admitting: Podiatry

## 2022-03-03 VITALS — BP 124/60

## 2022-03-03 DIAGNOSIS — B351 Tinea unguium: Secondary | ICD-10-CM | POA: Diagnosis not present

## 2022-03-03 DIAGNOSIS — R0602 Shortness of breath: Secondary | ICD-10-CM | POA: Insufficient documentation

## 2022-03-03 DIAGNOSIS — M79674 Pain in right toe(s): Secondary | ICD-10-CM

## 2022-03-03 DIAGNOSIS — E119 Type 2 diabetes mellitus without complications: Secondary | ICD-10-CM

## 2022-03-03 DIAGNOSIS — M79675 Pain in left toe(s): Secondary | ICD-10-CM | POA: Diagnosis not present

## 2022-03-03 DIAGNOSIS — F32A Depression, unspecified: Secondary | ICD-10-CM | POA: Insufficient documentation

## 2022-03-03 DIAGNOSIS — N950 Postmenopausal bleeding: Secondary | ICD-10-CM | POA: Insufficient documentation

## 2022-03-03 NOTE — Progress Notes (Signed)
  Subjective:  Patient ID: Amber Cook, female    DOB: [birth year],  MRN: [id number]  Ernie Hew presents to clinic today for preventative diabetic foot care and thick, elongated toenails right lower extremity which are tender when wearing enclosed shoe gear.  Chief Complaint  Patient presents with   Nail Problem    [facility] PCP-[facility], [place] Reader PCP VST-2or 3 months ago   New problem(s): None.   PCP is Holland Commons, FNP.  Allergies  Allergen Reactions   Erythromycin Nausea And Vomiting    Stomach pain   Ciclesonide     Other reaction(s): Unknown   Codeine Nausea Only   Ezetimibe     Other reaction(s): myalgia   Fluvastatin Sodium     Other reaction(s): myalgia   Lidocaine    Other     pomagranite--tongue itching  Nuts-tongue with a funny feeling   Pomegranate (Punica Granatum)     Other reaction(s): Unknown   Prilosec [Omeprazole]    Statins Other (See Comments)    Muscle cramps.     Review of Systems: Negative except as noted in the HPI.  Objective: No changes noted in today's physical examination. Vitals:   03/03/22 1322  BP: 124/60   Amber Cook is a pleasant 76 y.o. female morbidly obese in NAD. AAO x 3.  Vascular Examination: Vascular status intact b/l with palpable pedal pulses. Pedal hair present b/l. CFT immediate b/l. No pain with calf compression b/l. Skin temperature gradient WNL b/l. Nonpitting edema noted b/l lower extremities.  Neurological Examination: Sensation grossly intact b/l with 10 gram monofilament. Vibratory sensation intact b/l.   Dermatological Examination: Pedal skin with normal turgor, texture and tone b/l. Toenail  right 2nd digit b/l thick, discolored, elongated with subungual debris and pain on dorsal palpation.   No hyperkeratotic lesions noted b/l. Nondystrophic toenails 1-5 left foot, 3-5 right foot, and right great toe.  Musculoskeletal Examination: Normal muscle strength 5/5 to all lower extremity  muscle groups bilaterally. No pain, crepitus or joint limitation noted with ROM b/l LE. No gross bony pedal deformities b/l. Patient ambulates independently without assistive aids.  Radiographs: None  Assessment/Plan: 1. Pain due to onychomycosis of toenails of both feet   2. Diabetes mellitus without complication ([place])     No orders of the defined types were placed in this encounter.  -Consent given for treatment as described below: -Examined patient. -Continue diabetic foot care principles: inspect feet daily, monitor glucose as recommended by PCP and/or Endocrinologist, and follow prescribed diet per PCP, Endocrinologist and/or dietician. -Continue supportive shoe gear daily. -Mycotic toenails right second digit were debrided in length and girth with sterile nail nippers and dremel without iatrogenic bleeding. -Nondystrophic toenails trimmed bilateral great toes, 3-5 bilaterally, and L 2nd toe. -Patient/POA to call should there be question/concern in the interim.   Return in about 3 months (around 06/02/2022).  Marzetta Board, DPM

## 2022-03-13 ENCOUNTER — Ambulatory Visit: Payer: Medicare HMO | Attending: Cardiovascular Disease | Admitting: Cardiovascular Disease

## 2022-03-13 ENCOUNTER — Encounter: Payer: Self-pay | Admitting: Cardiovascular Disease

## 2022-03-13 VITALS — BP 119/68 | HR 78 | Ht 66.0 in | Wt 274.0 lb

## 2022-03-13 DIAGNOSIS — G4733 Obstructive sleep apnea (adult) (pediatric): Secondary | ICD-10-CM | POA: Diagnosis not present

## 2022-03-13 DIAGNOSIS — I872 Venous insufficiency (chronic) (peripheral): Secondary | ICD-10-CM | POA: Diagnosis not present

## 2022-03-13 DIAGNOSIS — Q2111 Secundum atrial septal defect: Secondary | ICD-10-CM

## 2022-03-13 DIAGNOSIS — I10 Essential (primary) hypertension: Secondary | ICD-10-CM

## 2022-03-13 DIAGNOSIS — I50812 Chronic right heart failure: Secondary | ICD-10-CM

## 2022-03-13 DIAGNOSIS — E7801 Familial hypercholesterolemia: Secondary | ICD-10-CM | POA: Diagnosis not present

## 2022-03-13 NOTE — Patient Instructions (Addendum)
Medication Instructions:  --Our pharmacist will be in touch about restarting Repatha  *If you need a refill on your cardiac medications before your next appointment, please call your pharmacy*  Follow-Up: At [facility], you and your health needs are our priority.  As part of our continuing mission to provide you with exceptional heart care, we have created designated Provider Care Teams.  These Care Teams include your primary Cardiologist (physician) and Advanced Practice Providers (APPs -  Physician Assistants and Nurse Practitioners) who all work together to provide you with the care you need, when you need it.  We recommend signing up for the patient portal called "MyChart".  Sign up information is provided on this After Visit Summary.  MyChart is used to connect with patients for Virtual Visits (Telemedicine).  Patients are able to view lab/test results, encounter notes, upcoming appointments, etc.  Non-urgent messages can be sent to your provider as well.   To learn more about what you can do with MyChart, go to [email or web address].    Your next appointment:   12 month(s)  The format for your next appointment:   In Person  Provider:   Sanda Klein, MD

## 2022-03-13 NOTE — Progress Notes (Signed)
Cardiology Office Note:    Date:  03/13/2022   ID:  Amber Cook, DOB 04-Feb-1946, MRN [id number]  PCP:  Holland Commons, FNP  Cardiologist:  Sanda Klein, MD  Electrophysiologist:  None   Referring MD: Holland Commons, FNP   Chief Complaint  Patient presents with   Edema     History of Present Illness:    Amber Cook is a 76 y.o. female with a hx of ostium secundum atrial septal defect closed in 2008 after a failed percutaneous closure, morbid obesity, obstructive sleep apnea, mixed hyperlipidemia intolerant to statins, left ventricular diastolic dysfunction, mild pulmonary artery hypertension, peripheral venous insufficiency with history of ablation of the right greater saphenous vein, hypothyroidism and hyperparathyroidism.  Overall she is doing well although she continues to have substantial lower extremity edema.  She does not have any shortness of breath at rest (orthopnea or PND or with her otherwise fairly sedentary lifestyle.  She has a prescription for furosemide 60 mg daily, but only takes 30 mg daily because otherwise she feels weak.  She does take her usual potassium supplement.  She occasionally has muscle cramps.  She has not had any problems with chest pain at rest or with activity.  She denies palpitations.  She is not using her CPAP ("has trouble cleaning it").  She does have daytime hypersomnolence.  She has not had any falls or focal neurological complaints.  She complains about her teeth cracking.  She has been taking vitamin D supplements 5000 mg daily for about a month now.  From a glucose control point of view she is doing reasonably well.  Recent hemoglobin A1c was 5.9%.  Unfortunately she has not been receiving any lipid-lowering medications.  She was intolerant to statins and ezetimibe due to myopathy.  She did well on Repatha but required assistance from the manufacturer due to the cost.  For some reason she was unable to get this assistance  last year.  Most recent lipid profile is quite unfavorable (cholesterol 238, HDL 50, LDL 165, triglycerides 117).   She did not have any coronary stenoses at her preoperative angiogram in 2008.  She has severely elevated total cholesterol and LDL cholesterol (usually LDL is greater than 200) but has been intolerant of numerous statins including pravastatin, atorvastatin, rosuvastatin and Livalo.  She also had side effects with Zetia.  All of these cause myalgia.  There was a plan to enroll her in the CLEAR trial of bempedoic acid, but these are dropped since follow-up cannot be arranged.  PCSK9 inhibitors were discussed with her several years ago, at that time the cost was very high.  She was able to take Repatha while she was receiving assistance, but this was dropped about a year and a half ago.   Her last echocardiogram in November 2016 showed normal left ventricular systolic function (EF 87-68%), grade 1 diastolic dysfunction, normal pulmonary artery pressure (although no calculation provided), normal right ventricular size and function and normal right atrial size, no mention of residual shunt.  A follow-up echocardiogram performed in August 2020 continues to show a mildly depressed right ventricle, but the right ventricular systolic pressure is only mildly elevated at 36 mmHg.  LV diastolic function parameters are consistent with impaired relaxation, but there is no evidence of elevated left atrial pressure.  Past Medical History:  Diagnosis Date   Arthritis    ASD (atrial septal defect), ostium secundum    surgical repair 7/08   Chronic right-sided CHF (  congestive heart failure) (HCC)    Colitis    Depression    takes Lexapro daily   Diabetes mellitus without complication (HCC)    borderline   Diastolic dysfunction 02/58/5277   grade 1   Elevated parathyroid hormone    GERD (gastroesophageal reflux disease)    but doesn't require meds   History of bronchitis    a month ago    Hypercalcemia    Hyperlipidemia    takes Welchol bid    Hypertension    takes Metoprolol nightly    Hypothyroidism    takes Synthroid daily   Internal hemorrhoids    Peripheral neuropathy    Pneumonia    hx of->a yr ago   Sleep apnea    supposed to use CPAP;repeat study tonight @ [facility]   Urinary frequency    takes Lasix daily   Vaginal yeast infection    Venous insufficiency    SVG ablation    Past Surgical History:  Procedure Laterality Date   2d echo with bubble study  03/31/12   atral septal defect repair  July 2009   after failed ASD closure   CARDIAC CATHETERIZATION  07/21/2006   Right & Left: normal coronary arteries, mod. pulmonary hypertension w/evidence of left to right shunting.   CERVICAL BIOPSY     COLONOSCOPY     left breast biopsy     NM MYOCAR PERF WALL MOTION  07/15/2006   high risk scan -   PARATHYROID EXPLORATION  04/05/2012   Procedure: PARATHYROID EXPLORATION;  Surgeon: Pedro Earls, MD;  Location: [facility];  Service: General;  Laterality: N/A;  Exploration of parathryroid and removal of left and right interior parathyroid   PARATHYROIDECTOMY     right vein ligation  2009   TONSILLECTOMY AND ADENOIDECTOMY     and adenoids at age 20   TRANSESOPHAGEAL ECHOCARDIOGRAM  08/13/2006   unsuccessful attempt at ASD closure    Current Medications: Current Meds  Medication Sig   aspirin 81 MG tablet Take 1 tablet (81 mg total) by mouth daily.   Cholecalciferol (VITAMIN D3) 125 MCG (5000 UT) CAPS 1 capsule   escitalopram (LEXAPRO) 20 MG tablet Take 1 tablet by mouth daily.   furosemide (LASIX) 20 MG tablet Take 60 mg by mouth daily.   latanoprost (XALATAN) 0.005 % ophthalmic solution Place 1 drop into both eyes daily.   levothyroxine (SYNTHROID) 150 MCG tablet Take 150 mcg by mouth daily.   Melatonin 1 MG TABS Take by mouth.   metoprolol succinate (TOPROL-XL) 25 MG 24 hr tablet Take 1 tablet (25 mg total) by mouth daily.   MULTIPLE VITAMIN PO Take by mouth  daily. Pt isn't sure of the dosage   potassium chloride (KLOR-CON) 10 MEQ tablet Take 2 tablets (20 mEq total) by mouth daily.   sodium chloride (OCEAN) 0.65 % SOLN nasal spray Place 1 spray into both nostrils as needed for congestion.   Specialty Vitamins Products (MAGNESIUM, AMINO ACID CHELATE,) 133 MG tablet Take 1 tablet by mouth as needed. 400 mg prn   spironolactone (ALDACTONE) 25 MG tablet Take 2 tablets by mouth daily.     Allergies:   Erythromycin, Ciclesonide, Codeine, Ezetimibe, Fluvastatin sodium, Lidocaine, Other, Pomegranate (punica granatum), Prilosec [omeprazole], and Statins   Social History   Socioeconomic History   Marital status: Single    Spouse name: Not on file   Number of children: Not on file   Years of education: Not on file   Highest education  level: Not on file  Occupational History   Not on file  Tobacco Use   Smoking status: Never   Smokeless tobacco: Never  Vaping Use   Vaping Use: Never used  Substance and Sexual Activity   Alcohol use: No   Drug use: No   Sexual activity: Not on file  Other Topics Concern   Not on file  Social History Narrative   Not on file   Social Determinants of Health   Financial Resource Strain: Not on file  Food Insecurity: Not on file  Transportation Needs: Not on file  Physical Activity: Not on file  Stress: Not on file  Social Connections: Not on file     Family History: The patient's family history includes Asthma in her sister; Cancer in her maternal grandfather, maternal uncle, and mother; Diabetes in her sister and sister; Heart attack in her maternal grandmother; Heart failure in her paternal grandfather; Hyperlipidemia in her sister and sister; Hypertension in her sister and sister; Kidney disease in her paternal grandfather; Pneumonia in her father.  ROS:   Please see the history of present illness.     All other systems reviewed and are negative.  EKGs/Labs/Other Studies Reviewed:    The following  studies were reviewed today: Echocardiogram 11/04/2021    1. Left ventricular ejection fraction, by estimation, is 60 to 65%. The  left ventricle has normal function. The left ventricle has no regional  wall motion abnormalities. Left ventricular diastolic parameters were  normal.   2. Right ventricular systolic function is moderately reduced. The right  ventricular size is moderately enlarged. There is normal pulmonary artery  systolic pressure.   3. Left atrial size was moderately dilated.   4. Post surgical closure of ASD no residual shunting noted can consider  f/u bubble study if clinically indicated given RV enlargement.   5. Right atrial size was moderately dilated.   6. The mitral valve is normal in structure. No evidence of mitral valve  regurgitation. No evidence of mitral stenosis.   7. The aortic valve is tricuspid. Aortic valve regurgitation is not  visualized. No aortic stenosis is present.   8. The inferior vena cava is normal in size with greater than 50%  respiratory variability, suggesting right atrial pressure of 3 mmHg.   EKG:  EKG is  ordered today.  EKG is ordered today and shows normal sinus rhythm, reduced voltage due to obesity, sharp Q waves in inferior leads, otherwise normal tracing.  QTc 424 ms  Recent Labs: Labs from PCP 02/26/2022  Hemoglobin A1c 5.9% cholesterol 238, HDL 50, LDL 165, triglycerides 117 Potassium 4.8, creatinine 1.0, normal liver function tests  Recent Lipid Panel    Component Value Date/Time   CHOL 248 (H) 11/04/2021 1253   TRIG 133 11/04/2021 1253   HDL 45 11/04/2021 1253   CHOLHDL 5.5 (H) 11/04/2021 1253   CHOLHDL 6.3 04/01/2012 1436   CHOLHDL 6.3 04/01/2012 1436   VLDL 51 (H) 04/01/2012 1436   VLDL 53 (H) 04/01/2012 1436   LDLCALC 179 (H) 11/04/2021 1253  08/26/2018 total cholesterol 263, HDL 41, LDL 185, triglycerides 185.   03/13/2020 total cholesterol 233, HDL 39, LDL 166, triglycerides 152  Physical Exam:    VS:  BP  119/68 (BP Location: Left Arm, Patient Position: Sitting, Cuff Size: Large)   Pulse 78   Ht '5\' 6"'$  (1.676 m)   Wt 274 lb (124.3 kg)   SpO2 96%   BMI 44.22 kg/m  Wt Readings from Last 3 Encounters:  03/13/22 274 lb (124.3 kg)  10/17/21 283 lb (128.4 kg)  09/16/21 281 lb 3.2 oz (127.6 kg)      General: Alert, oriented x3, no distress, morbidly obese Head: no evidence of trauma, PERRL, EOMI, no exophtalmos or lid lag, no myxedema, no xanthelasma; normal ears, nose and oropharynx Neck: normal jugular venous pulsations and no hepatojugular reflux; brisk carotid pulses without delay and no carotid bruits Chest: clear to auscultation, no signs of consolidation by percussion or palpation, normal fremitus, symmetrical and full respiratory excursions Cardiovascular: normal position and quality of the apical impulse, regular rhythm, normal first and second heart sounds, no murmurs, rubs or gallops Abdomen: no tenderness or distention, no masses by palpation, no abnormal pulsatility or arterial bruits, normal bowel sounds, no hepatosplenomegaly Extremities: no clubbing, cyanosis.  2+ edema on the right, 3+ edema on the left ankle, no blisters or weeping wounds, no ulcerations, no redness or tenderness Neurological: grossly nonfocal Psych: Normal mood and affect    ASSESSMENT:    1. Chronic right-sided heart failure ([place])   2. OSA (obstructive sleep apnea)   3. Heterozygous familial hypercholesterolemia   4. ASD (atrial septal defect), ostium secundum   5. Morbid obesity ([place])   6. Peripheral venous insufficiency   7. Essential hypertension      PLAN:    In order of problems listed above:  RHF: As before, her symptoms are right heart failure not left heart failure.  Edema is likely multifactorial due to peripheral venous insufficiency, but also due to morbid obesity and insufficiently treated OSA (she has well-documented venous reflux and has previously undergone unilateral  saphenectomy). While there may be some component of fixed pulmonary arteriolar disease due to long history before the repair of her atrial septal defect, untreated obstructive sleep apnea is probably worsening the situation.  Not sure what her "dry weight" is anymore.  Appears that she has gained some true weight. OSA: Also went over the pathophysiology of sleep apnea in detail and recommended 100% compliance with CPAP.  She does have daytime hypersomnolence and fatigue.  She has evidence of cor pulmonale on echo. HLP: I statin myopathy and also intolerant to ezetimibe.  Need to get her back on Repatha.  Will try to apply for assistance again.  I suspect that she has heterozygous familial hypercholesterolemia.  So far without overt PAD or CAD. ASD: No evidence of residual shunt by recent echo. Morbid obesity: Contributing to sleep apnea and pulmonary artery hypertension. Peripheral venous insufficiency: Keep legs elevated or wear compression stockings throughout the day. HTN:  Controlled.   Medication Adjustments/Labs and Tests Ordered: Current medicines are reviewed at length with the patient today.  Concerns regarding medicines are outlined above.  Orders Placed This Encounter  Procedures   EKG 12-Lead    No orders of the defined types were placed in this encounter.    Patient Instructions  Medication Instructions:  --Our pharmacist will be in touch about restarting Repatha  *If you need a refill on your cardiac medications before your next appointment, please call your pharmacy*  Follow-Up: At [facility], you and your health needs are our priority.  As part of our continuing mission to provide you with exceptional heart care, we have created designated Provider Care Teams.  These Care Teams include your primary Cardiologist (physician) and Advanced Practice Providers (APPs -  Physician Assistants and Nurse Practitioners) who all work together to provide you with the care you  need, when you need it.  We recommend signing up for the patient portal called "MyChart".  Sign up information is provided on this After Visit Summary.  MyChart is used to connect with patients for Virtual Visits (Telemedicine).  Patients are able to view lab/test results, encounter notes, upcoming appointments, etc.  Non-urgent messages can be sent to your provider as well.   To learn more about what you can do with MyChart, go to [email or web address].    Your next appointment:   12 month(s)  The format for your next appointment:   In Person  Provider:   Sanda Klein, MD             Signed, Sanda Klein, MD  03/13/2022 12:24 PM    [place]

## 2022-04-14 NOTE — Telephone Encounter (Signed)
NOT NEEDED

## 2022-04-16 DIAGNOSIS — H40053 Ocular hypertension, bilateral: Secondary | ICD-10-CM | POA: Diagnosis not present

## 2022-04-21 ENCOUNTER — Other Ambulatory Visit: Payer: Self-pay | Admitting: Pharmacist Clinician (PhC)/ Clinical Pharmacy Specialist

## 2022-04-21 ENCOUNTER — Telehealth: Payer: Self-pay

## 2022-04-21 ENCOUNTER — Other Ambulatory Visit (HOSPITAL_COMMUNITY): Payer: Self-pay

## 2022-04-21 NOTE — Telephone Encounter (Signed)
Pharmacy Patient Advocate Encounter  Prior Authorization for Repatha '140mg'$ /ml  has been approved.    key# [id number] Effective dates: 1.1.24 through 12.31.24

## 2022-04-22 ENCOUNTER — Other Ambulatory Visit (HOSPITAL_COMMUNITY): Payer: Self-pay

## 2022-04-22 MED ORDER — REPATHA SURECLICK 140 MG/ML ~~LOC~~ SOAJ
140.0000 mg | SUBCUTANEOUS | 3 refills | Status: AC
  Filled 2022-04-22: qty 6, 84d supply, fill #0
  Filled 2022-08-20: qty 6, 84d supply, fill #1
  Filled 2022-11-19: qty 6, 84d supply, fill #2

## 2022-04-22 NOTE — Telephone Encounter (Signed)
Renewed [employer] grant for patient.  ID [id number]  Rx sent to [place] - indicate she would like it to be mailed to her

## 2022-04-23 ENCOUNTER — Other Ambulatory Visit (HOSPITAL_COMMUNITY): Payer: Self-pay

## 2022-04-27 ENCOUNTER — Other Ambulatory Visit (HOSPITAL_COMMUNITY): Payer: Self-pay

## 2022-04-27 DIAGNOSIS — R7303 Prediabetes: Secondary | ICD-10-CM | POA: Diagnosis not present

## 2022-04-27 DIAGNOSIS — B3731 Acute candidiasis of vulva and vagina: Secondary | ICD-10-CM | POA: Diagnosis not present

## 2022-04-27 DIAGNOSIS — I509 Heart failure, unspecified: Secondary | ICD-10-CM | POA: Diagnosis not present

## 2022-04-27 DIAGNOSIS — G4733 Obstructive sleep apnea (adult) (pediatric): Secondary | ICD-10-CM | POA: Diagnosis not present

## 2022-04-27 DIAGNOSIS — Z0000 Encounter for general adult medical examination without abnormal findings: Secondary | ICD-10-CM | POA: Diagnosis not present

## 2022-04-27 DIAGNOSIS — E559 Vitamin D deficiency, unspecified: Secondary | ICD-10-CM | POA: Diagnosis not present

## 2022-04-27 DIAGNOSIS — E039 Hypothyroidism, unspecified: Secondary | ICD-10-CM | POA: Diagnosis not present

## 2022-04-27 DIAGNOSIS — E7800 Pure hypercholesterolemia, unspecified: Secondary | ICD-10-CM | POA: Diagnosis not present

## 2022-05-09 ENCOUNTER — Other Ambulatory Visit (HOSPITAL_COMMUNITY): Payer: Self-pay

## 2022-06-10 ENCOUNTER — Ambulatory Visit (INDEPENDENT_AMBULATORY_CARE_PROVIDER_SITE_OTHER): Payer: Medicare HMO | Admitting: Podiatry

## 2022-06-10 DIAGNOSIS — Z91199 Patient's noncompliance with other medical treatment and regimen due to unspecified reason: Secondary | ICD-10-CM

## 2022-06-10 NOTE — Progress Notes (Signed)
1. No-show for appointment     

## 2022-06-16 DIAGNOSIS — R7303 Prediabetes: Secondary | ICD-10-CM | POA: Diagnosis not present

## 2022-06-16 DIAGNOSIS — E559 Vitamin D deficiency, unspecified: Secondary | ICD-10-CM | POA: Diagnosis not present

## 2022-06-16 DIAGNOSIS — E7800 Pure hypercholesterolemia, unspecified: Secondary | ICD-10-CM | POA: Diagnosis not present

## 2022-06-16 DIAGNOSIS — I509 Heart failure, unspecified: Secondary | ICD-10-CM | POA: Diagnosis not present

## 2022-06-16 DIAGNOSIS — E039 Hypothyroidism, unspecified: Secondary | ICD-10-CM | POA: Diagnosis not present

## 2022-08-20 ENCOUNTER — Other Ambulatory Visit (HOSPITAL_COMMUNITY): Payer: Self-pay

## 2022-08-25 ENCOUNTER — Other Ambulatory Visit (HOSPITAL_COMMUNITY): Payer: Self-pay

## 2022-10-16 DIAGNOSIS — Z961 Presence of intraocular lens: Secondary | ICD-10-CM | POA: Diagnosis not present

## 2022-10-16 DIAGNOSIS — H43393 Other vitreous opacities, bilateral: Secondary | ICD-10-CM | POA: Diagnosis not present

## 2022-10-16 DIAGNOSIS — H40053 Ocular hypertension, bilateral: Secondary | ICD-10-CM | POA: Diagnosis not present

## 2022-10-16 DIAGNOSIS — H2512 Age-related nuclear cataract, left eye: Secondary | ICD-10-CM | POA: Diagnosis not present

## 2022-10-16 DIAGNOSIS — H26492 Other secondary cataract, left eye: Secondary | ICD-10-CM | POA: Diagnosis not present

## 2022-10-26 DIAGNOSIS — E7800 Pure hypercholesterolemia, unspecified: Secondary | ICD-10-CM | POA: Diagnosis not present

## 2022-10-26 DIAGNOSIS — R7303 Prediabetes: Secondary | ICD-10-CM | POA: Diagnosis not present

## 2022-11-02 DIAGNOSIS — E559 Vitamin D deficiency, unspecified: Secondary | ICD-10-CM | POA: Diagnosis not present

## 2022-11-02 DIAGNOSIS — R7303 Prediabetes: Secondary | ICD-10-CM | POA: Diagnosis not present

## 2022-11-02 DIAGNOSIS — K625 Hemorrhage of anus and rectum: Secondary | ICD-10-CM | POA: Diagnosis not present

## 2022-11-02 DIAGNOSIS — E7800 Pure hypercholesterolemia, unspecified: Secondary | ICD-10-CM | POA: Diagnosis not present

## 2022-11-02 DIAGNOSIS — I10 Essential (primary) hypertension: Secondary | ICD-10-CM | POA: Diagnosis not present

## 2022-11-02 DIAGNOSIS — R195 Other fecal abnormalities: Secondary | ICD-10-CM | POA: Diagnosis not present

## 2022-11-04 ENCOUNTER — Ambulatory Visit: Payer: Medicare HMO | Admitting: Podiatry

## 2022-11-04 ENCOUNTER — Ambulatory Visit (INDEPENDENT_AMBULATORY_CARE_PROVIDER_SITE_OTHER): Payer: Medicare HMO | Admitting: Podiatry

## 2022-11-04 DIAGNOSIS — Z91199 Patient's noncompliance with other medical treatment and regimen due to unspecified reason: Secondary | ICD-10-CM

## 2022-11-05 NOTE — Progress Notes (Signed)
1. No-show for appointment     

## 2022-11-19 ENCOUNTER — Other Ambulatory Visit (HOSPITAL_COMMUNITY): Payer: Self-pay

## 2022-11-26 ENCOUNTER — Other Ambulatory Visit (HOSPITAL_COMMUNITY): Payer: Self-pay

## 2022-12-08 ENCOUNTER — Ambulatory Visit: Payer: Medicare HMO | Admitting: Podiatry

## 2023-02-10 ENCOUNTER — Other Ambulatory Visit: Payer: Self-pay | Admitting: Cardiovascular Disease

## 2023-03-22 ENCOUNTER — Emergency Department (HOSPITAL_BASED_OUTPATIENT_CLINIC_OR_DEPARTMENT_OTHER)
Admission: EM | Admit: 2023-03-22 | Discharge: 2023-03-22 | Disposition: A | Discharge status: Home / Self Care | Payer: Medicare HMO | Attending: Emergency Medicine | Admitting: Emergency Medicine

## 2023-03-22 ENCOUNTER — Encounter (HOSPITAL_BASED_OUTPATIENT_CLINIC_OR_DEPARTMENT_OTHER): Payer: Self-pay | Admitting: Emergency Medicine

## 2023-03-22 ENCOUNTER — Emergency Department (HOSPITAL_BASED_OUTPATIENT_CLINIC_OR_DEPARTMENT_OTHER): Payer: Medicare HMO

## 2023-03-22 ENCOUNTER — Other Ambulatory Visit: Payer: Self-pay

## 2023-03-22 DIAGNOSIS — I110 Hypertensive heart disease with heart failure: Secondary | ICD-10-CM | POA: Diagnosis not present

## 2023-03-22 DIAGNOSIS — R0602 Shortness of breath: Secondary | ICD-10-CM | POA: Diagnosis not present

## 2023-03-22 DIAGNOSIS — R9389 Abnormal findings on diagnostic imaging of other specified body structures: Secondary | ICD-10-CM | POA: Diagnosis not present

## 2023-03-22 DIAGNOSIS — I509 Heart failure, unspecified: Secondary | ICD-10-CM | POA: Insufficient documentation

## 2023-03-22 DIAGNOSIS — E039 Hypothyroidism, unspecified: Secondary | ICD-10-CM | POA: Insufficient documentation

## 2023-03-22 DIAGNOSIS — Z7982 Long term (current) use of aspirin: Secondary | ICD-10-CM | POA: Insufficient documentation

## 2023-03-22 DIAGNOSIS — I10 Essential (primary) hypertension: Secondary | ICD-10-CM | POA: Diagnosis not present

## 2023-03-22 DIAGNOSIS — Z79899 Other long term (current) drug therapy: Secondary | ICD-10-CM | POA: Insufficient documentation

## 2023-03-22 DIAGNOSIS — B9789 Other viral agents as the cause of diseases classified elsewhere: Secondary | ICD-10-CM | POA: Diagnosis not present

## 2023-03-22 DIAGNOSIS — Z20822 Contact with and (suspected) exposure to covid-19: Secondary | ICD-10-CM | POA: Insufficient documentation

## 2023-03-22 DIAGNOSIS — R059 Cough, unspecified: Secondary | ICD-10-CM | POA: Diagnosis not present

## 2023-03-22 DIAGNOSIS — E119 Type 2 diabetes mellitus without complications: Secondary | ICD-10-CM | POA: Diagnosis not present

## 2023-03-22 DIAGNOSIS — J069 Acute upper respiratory infection, unspecified: Secondary | ICD-10-CM | POA: Diagnosis not present

## 2023-03-22 LAB — RESP PANEL BY RT-PCR (RSV, FLU A&B, COVID)  RVPGX2
Influenza A by PCR: NEGATIVE
Influenza B by PCR: NEGATIVE
Resp Syncytial Virus by PCR: NEGATIVE
SARS Coronavirus 2 by RT PCR: NEGATIVE

## 2023-03-22 MED ORDER — ALBUTEROL SULFATE HFA 108 (90 BASE) MCG/ACT IN AERS
2.0000 | INHALATION_SPRAY | Freq: Once | RESPIRATORY_TRACT | Status: AC
  Administered 2023-03-22: 2 via RESPIRATORY_TRACT
  Filled 2023-03-22: qty 6.7

## 2023-03-22 MED ORDER — AZITHROMYCIN 250 MG PO TABS: ORAL_TABLET | ORAL | 0 refills | Status: AC

## 2023-03-22 NOTE — ED Provider Notes (Signed)
[place] [facility] AT [facility] Provider Note   CSN: [id number] Arrival date & time: 03/22/23  1206     History  Chief Complaint  Patient presents with   Cough    Amber Cook is a 77 y.o. female with medical history of chronic right-sided CHF, diabetes, GERD, history bronchitis, hypertension, hypothyroid, venous insufficiency who [place] chronic leg swelling.  Patient reports to ED for evaluation of URI symptoms.  States she has been having symptoms for the last 2 to 3 days.  Endorsing a cough with congestion and shortness of breath.  Denies chest pain, lightheadedness, dizziness, weakness, leg swelling beyond baseline.  States she has been taking Tylenol at home for symptoms.  Reports that her neighbor who she often travels with is sick with similar symptoms.  Denies any known fevers at home.  Endorsing a slight sore throat.  Denies chest pain.   Cough Associated symptoms: shortness of breath and sore throat   Associated symptoms: no chest pain and no fever        Home Medications Prior to Admission medications   Medication Sig Start Date End Date Taking? Authorizing Provider  azithromycin (ZITHROMAX Z-PAK) 250 MG tablet Take 2 tablets (500 mg total) by mouth daily for 1 day, THEN 1 tablet (250 mg total) daily for 4 days. 03/22/23 03/27/23 Yes Al Decant, PA-C  amoxicillin-clavulanate (AUGMENTIN) 875-125 MG tablet Take 1 tablet by mouth every 12 (twelve) hours. Patient not taking: Reported on 03/13/2022 12/22/21   Pollyann Savoy, MD  aspirin 81 MG tablet Take 1 tablet (81 mg total) by mouth daily. 07/27/18   Croitoru, Mihai, MD  Cholecalciferol (VITAMIN D3) 125 MCG (5000 UT) CAPS 1 capsule 09/25/21   [provider]  escitalopram (LEXAPRO) 20 MG tablet Take 1 tablet by mouth daily. 01/12/14   [provider]  Evolocumab (REPATHA SURECLICK) 140 MG/ML SOAJ Inject 140 mg into the skin every 14 (fourteen) days. 04/22/22    Croitoru, Mihai, MD  furosemide (LASIX) 20 MG tablet Take 60 mg by mouth daily.    [provider]  latanoprost (XALATAN) 0.005 % ophthalmic solution Place 1 drop into both eyes daily. 01/15/15   [provider]  levothyroxine (SYNTHROID) 150 MCG tablet Take 150 mcg by mouth daily.    [provider]  Melatonin 1 MG TABS Take by mouth.    [provider]  metoprolol succinate (TOPROL-XL) 25 MG 24 hr tablet TAKE 1 TABLET(25 MG) BY MOUTH DAILY 02/10/23   Croitoru, Mihai, MD  MULTIPLE VITAMIN PO Take by mouth daily. Pt isn't sure of the dosage    [provider]  potassium chloride (KLOR-CON) 10 MEQ tablet Take 2 tablets (20 mEq total) by mouth daily. 10/17/21   Marjie Skiff E, PA-C  sodium chloride (OCEAN) 0.65 % SOLN nasal spray Place 1 spray into both nostrils as needed for congestion. 04/30/21   Felicie Morn, NP  Specialty Vitamins Products (MAGNESIUM, AMINO ACID CHELATE,) 133 MG tablet Take 1 tablet by mouth as needed. 400 mg prn    [provider]  spironolactone (ALDACTONE) 25 MG tablet Take 2 tablets by mouth daily. 01/07/15   [provider]  niacin (NIASPAN) 1000 MG CR tablet  08/05/12 06/08/13  [provider]      Allergies    Erythromycin, Ciclesonide, Codeine, Ezetimibe, Fluvastatin sodium, Lidocaine, Other, Pomegranate (punica granatum), Prilosec [omeprazole], and Statins    Review of Systems   Review of Systems  Constitutional:  Negative for fever.  HENT:  Positive for congestion and sore throat.   Respiratory:  Positive for cough and shortness of breath.   Cardiovascular:  Negative for chest pain.  Neurological:  Negative for dizziness, weakness and light-headedness.  All other systems reviewed and are negative.   Physical Exam Updated Vital Signs BP (!) 148/79 (BP Location: Left Arm)   Pulse 87   Temp 97.7 F (36.5 C)   Resp 20   Ht 5\' 6"  (1.676 m)   Wt 117.9 kg   SpO2 95%   BMI 41.97 kg/m   Physical Exam Vitals and nursing note reviewed.  Constitutional:      General: She is not in acute distress.    Appearance: Normal appearance. She is not ill-appearing, toxic-appearing or diaphoretic.  HENT:     Head: Normocephalic and atraumatic.     Nose: Nose normal.     Mouth/Throat:     Mouth: Mucous membranes are moist.     Pharynx: Oropharynx is clear. Posterior oropharyngeal erythema present. No oropharyngeal exudate.  Eyes:     Extraocular Movements: Extraocular movements intact.     Conjunctiva/sclera: Conjunctivae normal.     Pupils: Pupils are equal, round, and reactive to light.  Cardiovascular:     Rate and Rhythm: Normal rate and regular rhythm.  Pulmonary:     Effort: Pulmonary effort is normal.     Breath sounds: Normal breath sounds. No wheezing.  Abdominal:     General: Abdomen is flat. Bowel sounds are normal.     Palpations: Abdomen is soft.     Tenderness: There is no abdominal tenderness.  Musculoskeletal:     Cervical back: Normal range of motion and neck supple. No tenderness.  Neurological:     Mental Status: She is alert and oriented to person, place, and time.     ED Results / Procedures / Treatments   Labs (all labs ordered are listed, but only abnormal results are displayed) Labs Reviewed  RESP PANEL BY RT-PCR (RSV, FLU A&B, COVID)  [id number]    EKG EKG Interpretation Date/Time:  Monday March 22 2023 15:09:20 EST Ventricular Rate:  85 PR Interval:  192 QRS Duration:  80 QT Interval:  380 QTC Calculation: 452 R Axis:   64  Text Interpretation: Normal sinus rhythm Low voltage QRS Cannot rule out Inferior infarct , age undetermined Cannot rule out Anterior infarct , age undetermined Abnormal ECG When compared with ECG of 19-Oct-2006 07:55, PREVIOUS ECG IS PRESENT Confirmed by Richardean Canal ([id number]) on 03/22/2023 3:10:38 PM  Radiology DG Chest 2 View Result Date: 03/22/2023 CLINICAL DATA:  Shortness of breath. EXAM: CHEST - 2 VIEW  COMPARISON:  11/02/2013. FINDINGS: Bilateral lung fields are clear. Note is made of elevated right hemidiaphragm. Bilateral costophrenic angles are clear. Normal cardio-mediastinal silhouette. Sternotomy wires noted. No acute osseous abnormalities. The soft tissues are within normal limits. IMPRESSION: *No active cardiopulmonary disease. Electronically Signed   By: Jules Schick M.D.   On: 03/22/2023 16:47    Procedures Procedures   Medications Ordered in ED Medications  albuterol (VENTOLIN HFA) 108 (90 Base) MCG/ACT inhaler 2 puff (has no administration in time range)    ED Course/ Medical Decision Making/ A&P  Medical Decision Making Amount and/or Complexity of Data Reviewed Radiology: ordered.   77 year old who presents to ED for evaluation of URI symptoms.  Please see HPI for further details.  On examination patient is afebrile and nontachycardic.  Lung sounds are clear bilaterally, she is  not hypoxic.  Her abdomen is soft and compressible throughout.  Neurological examination is at baseline.  Posterior oropharynx is not erythematous, no exudate.  Overall nontoxic in appearance.  No edema to bilateral lower extremities.  Viral panel shows no viruses.  Chest x-ray shows no consolidations or effusions.  EKG without ischemia.  At this time, most likely cause of patient's symptoms due to a virus.  Patient advised and counseled on symptomatic care at home.  She will follow-up with her PCP for further management and care.  She was advised to return to the ED with any new or worsening signs or symptoms.  Stable to discharge home.  Final Clinical Impression(s) / ED Diagnoses Final diagnoses:  Viral URI with cough    Rx / DC Orders ED Discharge Orders          Ordered    azithromycin (ZITHROMAX Z-PAK) 250 MG tablet  Daily        03/22/23 1655              Al Decant, [place] 03/22/23 1656    Charlynne Pander, MD 03/22/23 [phone]

## 2023-03-22 NOTE — ED Triage Notes (Signed)
Pt states she has had cough with congestion and SHoB for the last 2-3 days

## 2023-03-22 NOTE — Discharge Instructions (Signed)
Please follow-up with your PCP for reevaluation.  Please utilize albuterol inhaler provided to you every 4 hours as needed for wheezing.  Please take azithromycin pack at home.  Please take 500 mg on day 1, followed by 250 mg for 4 days after.  Return to ED with any new or worsening symptoms.

## 2023-03-22 NOTE — ED Notes (Addendum)
Pt came to registration a few minutes ago asking why she had not been called, this RN stepped out and spoke with the pt who stated she "never heard [place] call", pt moved back to active

## 2023-03-22 NOTE — ED Notes (Signed)
RT Note: Patient given and educated on the use of an Albuterol inhaler with a spacer. Patient tolerated well and appears in no distress at this time

## 2023-03-27 ENCOUNTER — Other Ambulatory Visit: Payer: Self-pay | Admitting: Student

## 2023-03-29 ENCOUNTER — Other Ambulatory Visit: Payer: Self-pay

## 2023-03-29 MED ORDER — POTASSIUM CHLORIDE ER 10 MEQ PO TBCR: 20.0000 meq | EXTENDED_RELEASE_TABLET | Freq: Every day | ORAL | 0 refills | Status: AC

## 2023-04-05 ENCOUNTER — Ambulatory Visit: Payer: Medicare HMO | Attending: Cardiovascular Disease | Admitting: Cardiovascular Disease

## 2023-04-05 ENCOUNTER — Telehealth: Payer: Self-pay | Admitting: Emergency Medicine

## 2023-04-05 ENCOUNTER — Encounter: Payer: Self-pay | Admitting: Cardiovascular Disease

## 2023-04-05 VITALS — BP 126/78 | HR 68 | Ht 66.0 in | Wt 264.8 lb

## 2023-04-05 DIAGNOSIS — I50812 Chronic right heart failure: Secondary | ICD-10-CM | POA: Diagnosis not present

## 2023-04-05 DIAGNOSIS — I10 Essential (primary) hypertension: Secondary | ICD-10-CM

## 2023-04-05 DIAGNOSIS — I872 Venous insufficiency (chronic) (peripheral): Secondary | ICD-10-CM

## 2023-04-05 DIAGNOSIS — E7801 Familial hypercholesterolemia: Secondary | ICD-10-CM | POA: Diagnosis not present

## 2023-04-05 DIAGNOSIS — Q211 Atrial septal defect, unspecified: Secondary | ICD-10-CM | POA: Diagnosis not present

## 2023-04-05 DIAGNOSIS — G4733 Obstructive sleep apnea (adult) (pediatric): Secondary | ICD-10-CM | POA: Diagnosis not present

## 2023-04-05 NOTE — Progress Notes (Signed)
 Cardiology Office Note:    Date:  04/05/2023   ID:  Amber Cook, DOB [birth year], MRN [id number]  PCP:  Royden Ronal Czar, FNP  Cardiologist:  Jerel Balding, MD  Electrophysiologist:  None   Referring MD: Royden Ronal Czar, FNP   No chief complaint on file.    History of Present Illness:    Amber Cook is a 78 y.o. female with a hx of ostium secundum atrial septal defect closed in 2008 after a failed percutaneous closure, morbid obesity, obstructive sleep apnea, mixed hyperlipidemia intolerant to statins, left ventricular diastolic dysfunction, mild pulmonary artery hypertension, peripheral venous insufficiency with history of ablation of the right greater saphenous vein, hypothyroidism and hyperparathyroidism.  She had a very bad upper respiratory infection just before New Year's and had persistent coughing for 2 or 3 weeks, finally getting better.  She was prescribed 2 courses of antibiotics.  She has no cardiac complaints.  She denies shortness of breath at rest or with activity, chest pain, lower extremity edema, PND/orthopnea, claudication, focal neurological complaints, palpitations, dizziness or syncope.  She continues to have numbness and sometimes pain on the front of her left thigh which has been present ever since her heart surgery 15 years ago and sounds like femoral neuropathy.  She does not want to take gabapentin as it makes her sleepy.  Her edema is overall much better.  She has stopped taking furosemide , spironolactone and potassium supplements for several months and there has been no recurrence of the edema.  She still takes a magnesium supplement since it helps with muscle cramps.  She denies daytime hypersomnolence and reports generally good compliance with CPAP.  She has had problems with her teeth cracking and her dentist told her this could be from her diuretics.  She is back on Repatha .  I do not have her most recent lipid profile unfortunately.  Her  blood pressure was high when she first checked in at the day but was much better when I rechecked it later.  At home she reports that her systolic blood pressure is typically in the 110-120 range.  She did not have any coronary stenoses at her preoperative angiogram in 2008.  She has severely elevated total cholesterol and LDL cholesterol (usually LDL is greater than 200) but has been intolerant of numerous statins including pravastatin, atorvastatin, rosuvastatin and Livalo.  She also had side effects with Zetia.  All of these cause myalgia.  There was a plan to enroll her in the CLEAR trial of bempedoic acid, but these are dropped since follow-up cannot be arranged.  PCSK9 inhibitors were discussed with her several years ago, at that time the cost was very high.  She was able to take Repatha  while she was receiving assistance, but this was dropped about a year and a half ago.   Her last echocardiogram in November 2016 showed normal left ventricular systolic function (EF 60-65%), grade 1 diastolic dysfunction, normal pulmonary artery pressure (although no calculation provided), normal right ventricular size and function and normal right atrial size, no mention of residual shunt.  A follow-up echocardiogram performed in August 2020 continues to show a mildly depressed right ventricle, but the right ventricular systolic pressure is only mildly elevated at 36 mmHg.  LV diastolic function parameters are consistent with impaired relaxation, but there is no evidence of elevated left atrial pressure.  Past Medical History:  Diagnosis Date   Arthritis    ASD (atrial septal defect), ostium secundum  surgical repair 7/08   Chronic right-sided CHF (congestive heart failure) (HCC)    Colitis    Depression    takes Lexapro  daily   Diabetes mellitus without complication (HCC)    borderline   Diastolic dysfunction 03/31/2012   grade 1   Elevated parathyroid  hormone    GERD (gastroesophageal reflux disease)     but doesn't require meds   History of bronchitis    a month ago   Hypercalcemia    Hyperlipidemia    takes Welchol  bid    Hypertension    takes Metoprolol  nightly    Hypothyroidism    takes Synthroid  daily   Internal hemorrhoids    Peripheral neuropathy    Pneumonia    hx of->a yr ago   Sleep apnea    supposed to use CPAP;repeat study tonight @ [facility]   Urinary frequency    takes Lasix  daily   Vaginal yeast infection    Venous insufficiency    SVG ablation    Past Surgical History:  Procedure Laterality Date   2d echo with bubble study  03/31/12   atral septal defect repair  July 2009   after failed ASD closure   CARDIAC CATHETERIZATION  07/21/2006   Right & Left: normal coronary arteries, mod. pulmonary hypertension w/evidence of left to right shunting.   CERVICAL BIOPSY     COLONOSCOPY     left breast biopsy     NM MYOCAR PERF WALL MOTION  07/15/2006   high risk scan -   PARATHYROID  EXPLORATION  04/05/2012   Procedure: PARATHYROID  EXPLORATION;  Surgeon: Donnice KATHEE Lunger, MD;  Location: [facility] OR;  Service: General;  Laterality: N/A;  Exploration of parathryroid and removal of left and right interior parathyroid    PARATHYROIDECTOMY     right vein ligation  2009   TONSILLECTOMY AND ADENOIDECTOMY     and adenoids at age 49   TRANSESOPHAGEAL ECHOCARDIOGRAM  08/13/2006   unsuccessful attempt at ASD closure    Current Medications: Current Meds  Medication Sig   aspirin  81 MG tablet Take 1 tablet (81 mg total) by mouth daily.   Cholecalciferol (VITAMIN D3) 125 MCG (5000 UT) CAPS 1 capsule   escitalopram  (LEXAPRO ) 20 MG tablet Take 1 tablet by mouth daily.   Evolocumab  (REPATHA  SURECLICK) 140 MG/ML SOAJ Inject 140 mg into the skin every 14 (fourteen) days.   latanoprost (XALATAN) 0.005 % ophthalmic solution Place 1 drop into both eyes daily.   levothyroxine  (SYNTHROID ) 150 MCG tablet Take 150 mcg by mouth daily.   metoprolol  succinate (TOPROL -XL) 25 MG 24 hr tablet TAKE  1 TABLET(25 MG) BY MOUTH DAILY     Allergies:   Erythromycin, Atorvastatin, Ciclesonide, Codeine, Colesevelam , Ezetimibe, Fluvastatin sodium, Lidocaine , Other, Pomegranate (punica granatum), Prilosec [omeprazole], Rosuvastatin, Simvastatin, Statins, and Hydrocodone-acetaminophen    Social History   Socioeconomic History   Marital status: Single    Spouse name: Not on file   Number of children: Not on file   Years of education: Not on file   Highest education level: Not on file  Occupational History   Not on file  Tobacco Use   Smoking status: Never   Smokeless tobacco: Never  Vaping Use   Vaping status: Never Used  Substance and Sexual Activity   Alcohol use: No   Drug use: No   Sexual activity: Not on file  Other Topics Concern   Not on file  Social History Narrative   Not on file   Social Drivers of Health  Financial Resource Strain: Not on file  Food Insecurity: Not on file  Transportation Needs: Not on file  Physical Activity: Not on file  Stress: Not on file  Social Connections: Not on file     Family History: The patient's family history includes Asthma in her sister; Cancer in her maternal grandfather, maternal uncle, and mother; Diabetes in her sister and sister; Heart attack in her maternal grandmother; Heart failure in her paternal grandfather; Hyperlipidemia in her sister and sister; Hypertension in her sister and sister; Kidney disease in her paternal grandfather; Pneumonia in her father.  ROS:   Please see the history of present illness.     All other systems reviewed and are negative.  EKGs/Labs/Other Studies Reviewed:    The following studies were reviewed today: Echocardiogram 11/04/2021    1. Left ventricular ejection fraction, by estimation, is 60 to 65%. The  left ventricle has normal function. The left ventricle has no regional  wall motion abnormalities. Left ventricular diastolic parameters were  normal.   2. Right ventricular systolic  function is moderately reduced. The right  ventricular size is moderately enlarged. There is normal pulmonary artery  systolic pressure.   3. Left atrial size was moderately dilated.   4. Post surgical closure of ASD no residual shunting noted can consider  f/u bubble study if clinically indicated given RV enlargement.   5. Right atrial size was moderately dilated.   6. The mitral valve is normal in structure. No evidence of mitral valve  regurgitation. No evidence of mitral stenosis.   7. The aortic valve is tricuspid. Aortic valve regurgitation is not  visualized. No aortic stenosis is present.   8. The inferior vena cava is normal in size with greater than 50%  respiratory variability, suggesting right atrial pressure of 3 mmHg.   EKG: Personally reviewed the tracing from 03/22/2023 which shows normal sinus rhythm, generalized low voltage, questionable inferior Q waves and poor R wave progression, not meaningfully changed from previous tracings.  EKG Interpretation Date/Time:    Ventricular Rate:    PR Interval:    QRS Duration:    QT Interval:    QTC Calculation:   R Axis:      Text Interpretation:           Recent Labs: Labs from PCP 02/26/2022  Hemoglobin A1c 5.9% cholesterol 238, HDL 50, LDL 165, triglycerides 117 Potassium 4.8, creatinine 1.0, normal liver function tests  Recent Lipid Panel    Component Value Date/Time   CHOL 248 (H) 11/04/2021 1253   TRIG 133 11/04/2021 1253   HDL 45 11/04/2021 1253   CHOLHDL 5.5 (H) 11/04/2021 1253   CHOLHDL 6.3 04/01/2012 1436   CHOLHDL 6.3 04/01/2012 1436   VLDL 51 (H) 04/01/2012 1436   VLDL 53 (H) 04/01/2012 1436   LDLCALC 179 (H) 11/04/2021 1253  08/26/2018 total cholesterol 263, HDL 41, LDL 185, triglycerides 185.   03/13/2020 total cholesterol 233, HDL 39, LDL 166, triglycerides 152  Physical Exam:    VS:  BP 126/78   Pulse 68   Ht 5' 6 (1.676 m)   Wt 264 lb 12.8 oz (120.1 kg)   SpO2 92%   BMI 42.74 kg/m      Wt Readings from Last 3 Encounters:  04/05/23 264 lb 12.8 oz (120.1 kg)  03/22/23 260 lb (117.9 kg)  03/13/22 274 lb (124.3 kg)      General: Alert, oriented x3, no distress, morbidly obese Head: no evidence of trauma, PERRL, EOMI,  no exophtalmos or lid lag, no myxedema, no xanthelasma; normal ears, nose and oropharynx Neck: normal jugular venous pulsations and no hepatojugular reflux; brisk carotid pulses without delay and no carotid bruits Chest: clear to auscultation, no signs of consolidation by percussion or palpation, normal fremitus, symmetrical and full respiratory excursions Cardiovascular: normal position and quality of the apical impulse, regular rhythm, normal first and second heart sounds, no murmurs, rubs or gallops Abdomen: no tenderness or distention, no masses by palpation, no abnormal pulsatility or arterial bruits, normal bowel sounds, no hepatosplenomegaly Extremities: no clubbing, cyanosis.  No edema today/ Neurological: grossly nonfocal Psych: Normal mood and affect    ASSESSMENT:    1. Chronic right heart failure (HCC)   2. OSA (obstructive sleep apnea)   3. Heterozygous familial hypercholesterolemia   4. ASD (atrial septal defect)   5. Morbid obesity (HCC)   6. Peripheral venous insufficiency   7. Essential hypertension       PLAN:    In order of problems listed above:  RHF: Currently without much in the way of any signs of hypervolemia although she has stopped taking all her diuretics.  Edema was likely multifactorial due to peripheral venous insufficiency (she has well-documented venous reflux and has previously undergone unilateral saphenectomy), but also due to morbid obesity and insufficiently treated OSA. While there may be some component of fixed pulmonary arteriolar disease due to long history before the repair of her atrial septal defect, untreated obstructive sleep apnea is probably worsening the situation.  Suspect that this is her current  dry weight. OSA: Also went over the pathophysiology of sleep apnea in detail and recommended 100% compliance with CPAP.  She does have daytime hypersomnolence and fatigue.  She has evidence of cor pulmonale on echo. HLP: She had statin myopathy and is also intolerant to ezetimibe.  I suspect that she has heterozygous familial hypercholesterolemia.  So far without overt PAD or CAD.  Will request her most recent labs from [facility]. ASD: No evidence of residual shunt by most recent echo. Morbid obesity: Contributing to sleep apnea and pulmonary artery hypertension. Peripheral venous insufficiency: Keep legs elevated or wear compression stockings throughout the day. HTN:  Controlled.   Medication Adjustments/Labs and Tests Ordered: Current medicines are reviewed at length with the patient today.  Concerns regarding medicines are outlined above.  No orders of the defined types were placed in this encounter.   No orders of the defined types were placed in this encounter.    Patient Instructions  Medication Instructions:  No changes *If you need a refill on your cardiac medications before your next appointment, please call your pharmacy*   Follow-Up: At [facility], you and your health needs are our priority.  As part of our continuing mission to provide you with exceptional heart care, we have created designated Provider Care Teams.  These Care Teams include your primary Cardiologist (physician) and Advanced Practice Providers (APPs -  Physician Assistants and Nurse Practitioners) who all work together to provide you with the care you need, when you need it.  We recommend signing up for the patient portal called MyChart.  Sign up information is provided on this After Visit Summary.  MyChart is used to connect with patients for Virtual Visits (Telemedicine).  Patients are able to view lab/test results, encounter notes, upcoming appointments, etc.  Non-urgent  messages can be sent to your provider as well.   To learn more about what you can do with MyChart, go  to [email or web address].    Your next appointment:   1 year(s)  Provider:   Jerel Balding, MD              Signed, Jerel Balding, MD  04/05/2023 4:13 PM    [facility]

## 2023-04-05 NOTE — Telephone Encounter (Signed)
 Faxed request for patient's most recent Lipid panel

## 2023-04-05 NOTE — Patient Instructions (Signed)

## 2023-05-04 ENCOUNTER — Other Ambulatory Visit (HOSPITAL_COMMUNITY): Payer: Self-pay

## 2023-05-04 DIAGNOSIS — R739 Hyperglycemia, unspecified: Secondary | ICD-10-CM | POA: Diagnosis not present

## 2023-05-04 DIAGNOSIS — I10 Essential (primary) hypertension: Secondary | ICD-10-CM | POA: Diagnosis not present

## 2023-05-04 DIAGNOSIS — R7303 Prediabetes: Secondary | ICD-10-CM | POA: Diagnosis not present

## 2023-05-04 DIAGNOSIS — R195 Other fecal abnormalities: Secondary | ICD-10-CM | POA: Diagnosis not present

## 2023-05-04 DIAGNOSIS — I272 Pulmonary hypertension, unspecified: Secondary | ICD-10-CM | POA: Diagnosis not present

## 2023-05-04 DIAGNOSIS — R2681 Unsteadiness on feet: Secondary | ICD-10-CM | POA: Diagnosis not present

## 2023-05-04 DIAGNOSIS — E039 Hypothyroidism, unspecified: Secondary | ICD-10-CM | POA: Diagnosis not present

## 2023-05-04 DIAGNOSIS — Z0000 Encounter for general adult medical examination without abnormal findings: Secondary | ICD-10-CM | POA: Diagnosis not present

## 2023-05-04 DIAGNOSIS — I509 Heart failure, unspecified: Secondary | ICD-10-CM | POA: Diagnosis not present

## 2023-05-04 DIAGNOSIS — E559 Vitamin D deficiency, unspecified: Secondary | ICD-10-CM | POA: Diagnosis not present

## 2023-05-04 DIAGNOSIS — F325 Major depressive disorder, single episode, in full remission: Secondary | ICD-10-CM | POA: Diagnosis not present

## 2023-05-05 LAB — LAB REPORT - SCANNED: EGFR: 54

## 2023-05-06 ENCOUNTER — Encounter: Payer: Self-pay | Admitting: Cardiovascular Disease

## 2023-05-10 ENCOUNTER — Other Ambulatory Visit: Payer: Self-pay | Admitting: Cardiovascular Disease

## 2023-06-14 DIAGNOSIS — S39012A Strain of muscle, fascia and tendon of lower back, initial encounter: Secondary | ICD-10-CM | POA: Diagnosis not present

## 2023-06-25 ENCOUNTER — Telehealth: Payer: Self-pay | Admitting: Emergency Medicine

## 2023-06-25 DIAGNOSIS — E785 Hyperlipidemia, unspecified: Secondary | ICD-10-CM

## 2023-06-25 DIAGNOSIS — E7801 Familial hypercholesterolemia: Secondary | ICD-10-CM

## 2023-06-25 NOTE — Telephone Encounter (Signed)
 Left message with the following information:  Croitoru, Rachelle Hora, MD  Scheryl Marten, RN LDL cholesterol remains quite high on labs from PCP. She is intolerant to statins and ezetimibe. Would like her to have a coronary calcium score to see if we need to pursue the newer types of cholesterol meds.  Informed that the test is a flat fee of $99. I will place the order and someone will call to schedule this.   Left call back number for questions/concerns.

## 2023-07-12 ENCOUNTER — Ambulatory Visit (HOSPITAL_BASED_OUTPATIENT_CLINIC_OR_DEPARTMENT_OTHER)
Admission: RE | Admit: 2023-07-12 | Discharge: 2023-07-12 | Disposition: A | Discharge status: Home / Self Care | Payer: Self-pay | Source: Ambulatory Visit | Attending: Cardiovascular Disease | Admitting: Cardiovascular Disease

## 2023-07-12 DIAGNOSIS — E785 Hyperlipidemia, unspecified: Secondary | ICD-10-CM | POA: Insufficient documentation

## 2023-07-12 DIAGNOSIS — E7801 Familial hypercholesterolemia: Secondary | ICD-10-CM | POA: Insufficient documentation

## 2023-07-15 ENCOUNTER — Encounter: Payer: Self-pay | Admitting: Radiology

## 2023-07-28 ENCOUNTER — Encounter: Payer: Self-pay | Admitting: Cardiovascular Disease

## 2023-10-25 DIAGNOSIS — H43393 Other vitreous opacities, bilateral: Secondary | ICD-10-CM | POA: Diagnosis not present

## 2023-10-25 DIAGNOSIS — H2512 Age-related nuclear cataract, left eye: Secondary | ICD-10-CM | POA: Diagnosis not present

## 2023-10-25 DIAGNOSIS — Z961 Presence of intraocular lens: Secondary | ICD-10-CM | POA: Diagnosis not present

## 2023-10-25 DIAGNOSIS — H40053 Ocular hypertension, bilateral: Secondary | ICD-10-CM | POA: Diagnosis not present

## 2023-10-29 DIAGNOSIS — E039 Hypothyroidism, unspecified: Secondary | ICD-10-CM | POA: Diagnosis not present

## 2023-10-29 DIAGNOSIS — R7303 Prediabetes: Secondary | ICD-10-CM | POA: Diagnosis not present

## 2023-10-29 LAB — LAB REPORT - SCANNED
A1c: 6
EGFR: 56
TSH: 5 (ref 0.41–5.90)

## 2023-11-02 DIAGNOSIS — E039 Hypothyroidism, unspecified: Secondary | ICD-10-CM | POA: Diagnosis not present

## 2023-11-02 DIAGNOSIS — F325 Major depressive disorder, single episode, in full remission: Secondary | ICD-10-CM | POA: Diagnosis not present

## 2023-11-02 DIAGNOSIS — I272 Pulmonary hypertension, unspecified: Secondary | ICD-10-CM | POA: Diagnosis not present

## 2023-11-02 DIAGNOSIS — G720 Drug-induced myopathy: Secondary | ICD-10-CM | POA: Diagnosis not present

## 2023-11-02 DIAGNOSIS — I509 Heart failure, unspecified: Secondary | ICD-10-CM | POA: Diagnosis not present

## 2023-11-02 DIAGNOSIS — E559 Vitamin D deficiency, unspecified: Secondary | ICD-10-CM | POA: Diagnosis not present

## 2023-11-02 DIAGNOSIS — E7800 Pure hypercholesterolemia, unspecified: Secondary | ICD-10-CM | POA: Diagnosis not present

## 2023-11-02 DIAGNOSIS — R7303 Prediabetes: Secondary | ICD-10-CM | POA: Diagnosis not present

## 2023-11-10 ENCOUNTER — Ambulatory Visit: Payer: Self-pay | Admitting: Cardiovascular Disease

## 2024-03-31 ENCOUNTER — Encounter (HOSPITAL_BASED_OUTPATIENT_CLINIC_OR_DEPARTMENT_OTHER): Payer: Self-pay

## 2024-03-31 ENCOUNTER — Emergency Department (HOSPITAL_BASED_OUTPATIENT_CLINIC_OR_DEPARTMENT_OTHER): Admission: EM | Admit: 2024-03-31 | Discharge: 2024-03-31 | Disposition: A | Discharge status: Home / Self Care

## 2024-03-31 ENCOUNTER — Emergency Department (HOSPITAL_BASED_OUTPATIENT_CLINIC_OR_DEPARTMENT_OTHER)

## 2024-03-31 ENCOUNTER — Other Ambulatory Visit: Payer: Self-pay

## 2024-03-31 DIAGNOSIS — Z7982 Long term (current) use of aspirin: Secondary | ICD-10-CM | POA: Diagnosis not present

## 2024-03-31 DIAGNOSIS — R509 Fever, unspecified: Secondary | ICD-10-CM | POA: Insufficient documentation

## 2024-03-31 DIAGNOSIS — I509 Heart failure, unspecified: Secondary | ICD-10-CM | POA: Diagnosis not present

## 2024-03-31 DIAGNOSIS — I110 Hypertensive heart disease with heart failure: Secondary | ICD-10-CM | POA: Insufficient documentation

## 2024-03-31 DIAGNOSIS — R059 Cough, unspecified: Secondary | ICD-10-CM | POA: Diagnosis present

## 2024-03-31 DIAGNOSIS — E8809 Other disorders of plasma-protein metabolism, not elsewhere classified: Secondary | ICD-10-CM | POA: Insufficient documentation

## 2024-03-31 DIAGNOSIS — J40 Bronchitis, not specified as acute or chronic: Secondary | ICD-10-CM | POA: Diagnosis not present

## 2024-03-31 DIAGNOSIS — Z79899 Other long term (current) drug therapy: Secondary | ICD-10-CM | POA: Insufficient documentation

## 2024-03-31 LAB — CBC WITH DIFFERENTIAL/PLATELET
Abs Immature Granulocytes: 0.01 K/uL (ref 0.00–0.07)
Basophils Absolute: 0 K/uL (ref 0.0–0.1)
Basophils Relative: 0 %
Eosinophils Absolute: 0.1 K/uL (ref 0.0–0.5)
Eosinophils Relative: 2 %
HCT: 40.1 % (ref 36.0–46.0)
Hemoglobin: 13.1 g/dL (ref 12.0–15.0)
Immature Granulocytes: 0 %
Lymphocytes Relative: 25 %
Lymphs Abs: 1.8 K/uL (ref 0.7–4.0)
MCH: 30.6 pg (ref 26.0–34.0)
MCHC: 32.7 g/dL (ref 30.0–36.0)
MCV: 93.7 fL (ref 80.0–100.0)
Monocytes Absolute: 0.7 K/uL (ref 0.1–1.0)
Monocytes Relative: 10 %
Neutro Abs: 4.7 K/uL (ref 1.7–7.7)
Neutrophils Relative %: 63 %
Platelets: 226 K/uL (ref 150–400)
RBC: 4.28 MIL/uL (ref 3.87–5.11)
RDW: 12.4 % (ref 11.5–15.5)
WBC: 7.5 K/uL (ref 4.0–10.5)
nRBC: 0 % (ref 0.0–0.2)

## 2024-03-31 LAB — COMPREHENSIVE METABOLIC PANEL WITH GFR
ALT: 13 U/L (ref 0–44)
AST: 19 U/L (ref 15–41)
Albumin: 3.8 g/dL (ref 3.5–5.0)
Alkaline Phosphatase: 87 U/L (ref 38–126)
Anion gap: 9 (ref 5–15)
BUN: 19 mg/dL (ref 8–23)
CO2: 28 mmol/L (ref 22–32)
Calcium: 8.9 mg/dL (ref 8.9–10.3)
Chloride: 102 mmol/L (ref 98–111)
Creatinine, Ser: 0.88 mg/dL (ref 0.44–1.00)
GFR, Estimated: >60 mL/min
Glucose, Bld: 120 mg/dL — ABNORMAL HIGH (ref 70–99)
Potassium: 4 mmol/L (ref 3.5–5.1)
Sodium: 139 mmol/L (ref 135–145)
Total Bilirubin: 0.4 mg/dL (ref 0.0–1.2)
Total Protein: 6.3 g/dL — ABNORMAL LOW (ref 6.5–8.1)

## 2024-03-31 LAB — RESP PANEL BY RT-PCR (RSV, FLU A&B, COVID)  RVPGX2
Influenza A by PCR: NEGATIVE
Influenza B by PCR: NEGATIVE
Resp Syncytial Virus by PCR: NEGATIVE
SARS Coronavirus 2 by RT PCR: NEGATIVE

## 2024-03-31 LAB — PRO BRAIN NATRIURETIC PEPTIDE: Pro Brain Natriuretic Peptide: 249 pg/mL

## 2024-03-31 MED ORDER — AZITHROMYCIN 250 MG PO TABS: 250.0000 mg | ORAL_TABLET | Freq: Every day | ORAL | 0 refills | Status: AC

## 2024-03-31 MED ORDER — BENZONATATE 100 MG PO CAPS: 200.0000 mg | ORAL_CAPSULE | Freq: Three times a day (TID) | ORAL | 0 refills | Status: AC | PRN

## 2024-03-31 MED ORDER — PREDNISONE 10 MG PO TABS: 40.0000 mg | ORAL_TABLET | Freq: Every day | ORAL | 0 refills | Status: AC

## 2024-03-31 NOTE — ED Triage Notes (Addendum)
 Cough, congestion, fatigue, fevers for over 1 week.   Denies chest pain or SHOB

## 2024-03-31 NOTE — ED Provider Notes (Signed)
 " [place] [facility] Provider Note   CSN: [id number] Arrival date & time: 03/31/24  1401     Patient presents with: Cough   Amber Cook is a 79 y.o. female.  Patient with past history significant for chronic right-sided CHF, hypertension, hyperlipidemia, ASD who presents to the emergency department with concerns of a cough, congestion, fever and fatigue for the last week.  She denies any other sick contacts.  She does not feel she has had any sort of chest pain or shortness of breath but does report that she is having an increasingly productive cough with looks like something died coming out of her lungs.  She states that fevers have been improving but that she now has this ongoing cough.  Has been try to manage with Mucinex without significant improvement.  Denies any significant leg swelling or weight gain.  She is not currently on any diuretic.   Cough Associated symptoms: fever        Prior to Admission medications  Medication Sig Start Date End Date Taking? Authorizing Provider  azithromycin  (ZITHROMAX ) 250 MG tablet Take 1 tablet (250 mg total) by mouth daily. Take first 2 tablets together, then 1 every day until finished. 03/31/24  Yes Wassim Kirksey A, PA-C  benzonatate  (TESSALON ) 100 MG capsule Take 2 capsules (200 mg total) by mouth 3 (three) times daily as needed for up to 5 days for cough. 03/31/24 04/05/24 Yes Lumir Demetriou A, PA-C  predniSONE  (DELTASONE ) 10 MG tablet Take 4 tablets (40 mg total) by mouth daily. 03/31/24  Yes Carin Shipp A, PA-C  aspirin  81 MG tablet Take 1 tablet (81 mg total) by mouth daily. 07/27/18   Croitoru, Mihai, MD  Cholecalciferol (VITAMIN D3) 125 MCG (5000 UT) CAPS 1 capsule 09/25/21   [provider]  escitalopram  (LEXAPRO ) 20 MG tablet Take 1 tablet by mouth daily. 01/12/14   [provider]  Evolocumab  (REPATHA  SURECLICK) 140 MG/ML SOAJ Inject 140 mg into the skin every 14 (fourteen) days.  04/22/22   Croitoru, Mihai, MD  furosemide  (LASIX ) 20 MG tablet Take 60 mg by mouth daily. Patient not taking: Reported on 04/05/2023    [provider]  latanoprost (XALATAN) 0.005 % ophthalmic solution Place 1 drop into both eyes daily. 01/15/15   [provider]  levothyroxine  (SYNTHROID ) 150 MCG tablet Take 150 mcg by mouth daily.    [provider]  Magnesium 400 MG TABS Take 400 mg by mouth daily.    [provider]  Melatonin 1 MG TABS Take by mouth. Patient not taking: Reported on 04/05/2023    [provider]  metoprolol  succinate (TOPROL -XL) 25 MG 24 hr tablet TAKE 1 TABLET(25 MG) BY MOUTH DAILY 05/11/23   Croitoru, Mihai, MD  MULTIPLE VITAMIN PO Take by mouth daily. Pt isn't sure of the dosage Patient not taking: Reported on 04/05/2023    [provider]  potassium chloride  (KLOR-CON ) 10 MEQ tablet Take 2 tablets (20 mEq total) by mouth daily. Patient not taking: Reported on 04/05/2023 03/29/23   Croitoru, Jerel, MD  sodium chloride  (OCEAN) 0.65 % SOLN nasal spray Place 1 spray into both nostrils as needed for congestion. Patient not taking: Reported on 04/05/2023 04/30/21   Claudene Lenis, NP  Specialty Vitamins Products (MAGNESIUM, AMINO ACID CHELATE,) 133 MG tablet Take 1 tablet by mouth as needed. 400 mg prn Patient not taking: Reported on 04/05/2023    [provider]  spironolactone (ALDACTONE) 25 MG  tablet Take 2 tablets by mouth daily. Patient not taking: Reported on 04/05/2023 01/07/15   [provider]  niacin  (NIASPAN ) 1000 MG CR tablet  08/05/12 06/08/13  [provider]    Allergies: Erythromycin, Atorvastatin, Ciclesonide, Codeine, Colesevelam , Ezetimibe, Fluvastatin sodium, Lidocaine , Other, Pomegranate (punica granatum), Prilosec [omeprazole], Rosuvastatin, Simvastatin, Statins, and Hydrocodone-acetaminophen     Review of Systems  Constitutional:  Positive for fatigue and fever.  Respiratory:  Positive  for cough.   All other systems reviewed and are negative.   Updated Vital Signs BP (!) 142/115   Pulse 85   Temp 98.1 F (36.7 C)   Resp 20   SpO2 98%   Physical Exam Vitals and nursing note reviewed.  Constitutional:      General: She is not in acute distress.    Appearance: She is well-developed.  HENT:     Head: Normocephalic and atraumatic.  Eyes:     Conjunctiva/sclera: Conjunctivae normal.  Cardiovascular:     Rate and Rhythm: Normal rate and regular rhythm.     Heart sounds: No murmur heard. Pulmonary:     Effort: Pulmonary effort is normal. No respiratory distress.     Breath sounds: Normal breath sounds. No wheezing, rhonchi or rales.  Abdominal:     General: There is no distension.     Palpations: Abdomen is soft.     Tenderness: There is no abdominal tenderness. There is no guarding.  Musculoskeletal:        General: No swelling.     Cervical back: Neck supple.     Comments: Trace edema in bilateral lower extremities.  Skin:    General: Skin is warm and dry.     Capillary Refill: Capillary refill takes less than 2 seconds.  Neurological:     Mental Status: She is alert.  Psychiatric:        Mood and Affect: Mood normal.     (all labs ordered are listed, but only abnormal results are displayed) Labs Reviewed  COMPREHENSIVE METABOLIC PANEL WITH GFR - Abnormal; Notable for the following components:      Result Value   Glucose, Bld 120 (*)    Total Protein 6.3 (*)    All other components within normal limits  RESP PANEL BY RT-PCR (RSV, FLU A&B, COVID)  [id number]  CBC WITH DIFFERENTIAL/PLATELET  PRO BRAIN NATRIURETIC PEPTIDE    EKG: None  Radiology: DG Chest 2 View Result Date: 03/31/2024 EXAM: 2 VIEW(S) XRAY OF THE CHEST 03/31/2024 02:40:00 PM COMPARISON: 03/22/2023 CLINICAL HISTORY: chest congestion for 1 week FINDINGS: LUNGS AND PLEURA: No focal pulmonary opacity. No pleural effusion. No pneumothorax. HEART AND MEDIASTINUM: Sternotomy wires noted. No  acute abnormality of the cardiac and mediastinal silhouettes. BONES AND SOFT TISSUES: Surgical clips in lower neck. No acute osseous abnormality. IMPRESSION: 1. No acute cardiopulmonary process. Electronically signed by: Greig Pique MD MD 03/31/2024 03:28 PM EST RP Workstation: [id number]     Procedures   Medications Ordered in the ED - No data to display                                  Medical Decision Making Amount and/or Complexity of Data Reviewed Labs: ordered. Radiology: ordered.   This patient presents to the ED for concern of cough, fatigue, this involves an extensive number of treatment options, and is a complaint that carries with it a high risk of complications and morbidity.  The differential diagnosis includes viral URI, bronchitis, pneumonia, COVID-19, influenza   Co morbidities that complicate the patient evaluation  ASD, right-sided CHF, hypertension, hyperlipidemia   Additional history obtained:  Additional history obtained from chart review   Lab Tests:  I Ordered, and personally interpreted labs.  The pertinent results include: CBC unremarkable, CMP mild hyperglycemia and slight hypoproteinemia at 6.3, proBNP unremarkable at 249, respiratory panel negative   Imaging Studies ordered:  I ordered imaging studies including chest x-ray I independently visualized and interpreted imaging which showed no acute cardiopulmonary process I agree with the radiologist interpretation   Consultations Obtained:  I requested consultation with none,  and discussed lab and imaging findings as well as pertinent plan - they recommend: N/A   Problem List / ED Course / Critical interventions / Medication management  Patient past history significant for right-sided CHF, hypertension, hyperlipidemia, ASD presents the emergency department with concerns of a cough.  Reports cough been ongoing for the last week with associated congestion, fatigue, and fevers.  She denies any  chest pain or shortness of breath.  Has been try to manage at home with over-the-counter medication such as Mucinex.  She has not noticed any significant increase with breathing difficulties when ambulating.  No sick contacts as far she is aware. Exam reveals trace edema to bilateral lower extremities.  No abnormal heart or lung sounds.  She is otherwise well-appearing with stable vitals. Patient's workup today is largely reassuring with a normal CBC, slightly abnormal CMP with mild hypoalbuminemia seen at 6.3, proBNP unremarkable at 249, Rester panel negative.  Chest x-ray unremarkable for acute findings.  No obvious bronchitic changes are seen but patient's reported symptoms do appear to line up well with a bronchitis type picture.  Given her age and health risk, discussed possible invention including antitussive medications.  She is 7 days into her illness course without any significant signs of improvement and had discussion of possible steroid antibiotics.  Patient would prefer to be more aggressive with treatment and would like these medications started prior to her following up with her primary care prior.  With reassuring vitals, I do feel this is appropriate however did advise patient that she likely will have improvement on her own without any significant interventions but will take time.  She is currently stable and no acute indication or need for further emergent workup and treatment at this time.  Discharged home in stable condition. I have reviewed the patients home medicines and have made adjustments as needed   Social Determinants of Health:  None   Test / Admission - Considered:  Considered but stable for outpatient follow-up.  Final diagnoses:  Bronchitis    ED Discharge Orders          Ordered    predniSONE  (DELTASONE ) 10 MG tablet  Daily        03/31/24 1605    azithromycin  (ZITHROMAX ) 250 MG tablet  Daily        03/31/24 1605    benzonatate  (TESSALON ) 100 MG capsule  3  times daily PRN        03/31/24 1605               Wannetta Langland A, PA-C 03/31/24 1606  "

## 2024-03-31 NOTE — ED Notes (Signed)
 Patient transported to X-ray

## 2024-03-31 NOTE — Discharge Instructions (Addendum)
 You were seen in the emergency department today for concerns of cough and fatigue.  Your labs and imaging were thankfully reassuring but I suspect you likely have bronchitis.  I have started on a course of steroids and antibiotics try to help with this as well as cough medication.  Please follow-up closely with your primary care fighter.  For any concerns of new or worsening symptoms primarily with concerns of shortness of breath, return to the emergency department for evaluation.

## 2024-05-08 ENCOUNTER — Ambulatory Visit: Admitting: Cardiovascular Disease
